# Patient Record
Sex: Female | Born: 1960 | Race: Black or African American | Hispanic: No | Smoking: Current every day smoker
Health system: Southern US, Community
[De-identification: ages and names within clinical notes are randomized; demographics above are authoritative.]

## PROBLEM LIST (undated history)

## (undated) DIAGNOSIS — E559 Vitamin D deficiency, unspecified: Secondary | ICD-10-CM

## (undated) DIAGNOSIS — K219 Gastro-esophageal reflux disease without esophagitis: Secondary | ICD-10-CM

## (undated) DIAGNOSIS — K5909 Other constipation: Secondary | ICD-10-CM

## (undated) DIAGNOSIS — Z800 Family history of malignant neoplasm of digestive organs: Secondary | ICD-10-CM

## (undated) DIAGNOSIS — K449 Diaphragmatic hernia without obstruction or gangrene: Secondary | ICD-10-CM

## (undated) DIAGNOSIS — K589 Irritable bowel syndrome without diarrhea: Secondary | ICD-10-CM

## (undated) DIAGNOSIS — R1311 Dysphagia, oral phase: Secondary | ICD-10-CM

## (undated) DIAGNOSIS — H269 Unspecified cataract: Secondary | ICD-10-CM

## (undated) HISTORY — PX: COLONOSCOPY: SHX174

## (undated) HISTORY — DX: Dysphagia, oral phase: R13.11

## (undated) HISTORY — DX: Other constipation: K59.09

## (undated) HISTORY — PX: RECTOVAGINAL FISTULA CLOSURE: SUR265

## (undated) HISTORY — PX: GANGLION CYST EXCISION: SHX1691

## (undated) HISTORY — DX: Gastro-esophageal reflux disease without esophagitis: K21.9

## (undated) HISTORY — DX: Unspecified cataract: H26.9

## (undated) HISTORY — DX: Diaphragmatic hernia without obstruction or gangrene: K44.9

## (undated) HISTORY — PX: UPPER GASTROINTESTINAL ENDOSCOPY: SHX188

## (undated) HISTORY — PX: EYE SURGERY: SHX253

## (undated) HISTORY — DX: Vitamin D deficiency, unspecified: E55.9

## (undated) HISTORY — PX: SHOULDER SURGERY: SHX246

## (undated) HISTORY — PX: ILEOSTOMY: SHX1783

## (undated) HISTORY — DX: Family history of malignant neoplasm of digestive organs: Z80.0

## (undated) HISTORY — PX: ESOPHAGEAL DILATION: SHX303

## (undated) HISTORY — PX: ILEOSTOMY CLOSURE: SHX1784

## (undated) HISTORY — PX: ABDOMINAL HYSTERECTOMY: SHX81

## (undated) HISTORY — DX: Irritable bowel syndrome, unspecified: K58.9

---

## 1999-10-02 ENCOUNTER — Other Ambulatory Visit: Admission: RE | Admit: 1999-10-02 | Discharge: 1999-10-02 | Payer: Self-pay | Admitting: *Deleted

## 2000-11-05 ENCOUNTER — Other Ambulatory Visit: Admission: RE | Admit: 2000-11-05 | Discharge: 2000-11-05 | Payer: Self-pay | Admitting: *Deleted

## 2000-12-18 ENCOUNTER — Other Ambulatory Visit: Admission: RE | Admit: 2000-12-18 | Discharge: 2000-12-18 | Payer: Self-pay | Admitting: Obstetrics and Gynecology

## 2000-12-18 ENCOUNTER — Encounter (INDEPENDENT_AMBULATORY_CARE_PROVIDER_SITE_OTHER): Payer: Self-pay

## 2000-12-24 ENCOUNTER — Inpatient Hospital Stay (HOSPITAL_COMMUNITY): Admission: RE | Admit: 2000-12-24 | Discharge: 2000-12-25 | Payer: Self-pay | Admitting: Obstetrics and Gynecology

## 2000-12-24 ENCOUNTER — Encounter (INDEPENDENT_AMBULATORY_CARE_PROVIDER_SITE_OTHER): Payer: Self-pay

## 2001-01-21 ENCOUNTER — Ambulatory Visit (HOSPITAL_COMMUNITY): Admission: RE | Admit: 2001-01-21 | Discharge: 2001-01-21 | Payer: Self-pay | Admitting: General Surgery

## 2001-01-21 ENCOUNTER — Encounter: Payer: Self-pay | Admitting: General Surgery

## 2002-04-15 ENCOUNTER — Encounter: Payer: Self-pay | Admitting: General Surgery

## 2002-04-15 ENCOUNTER — Encounter: Admission: RE | Admit: 2002-04-15 | Discharge: 2002-04-15 | Payer: Self-pay | Admitting: General Surgery

## 2002-12-29 ENCOUNTER — Other Ambulatory Visit: Admission: RE | Admit: 2002-12-29 | Discharge: 2002-12-29 | Payer: Self-pay | Admitting: Family Medicine

## 2008-08-25 DIAGNOSIS — K449 Diaphragmatic hernia without obstruction or gangrene: Secondary | ICD-10-CM | POA: Insufficient documentation

## 2008-08-25 DIAGNOSIS — F17200 Nicotine dependence, unspecified, uncomplicated: Secondary | ICD-10-CM | POA: Insufficient documentation

## 2008-08-25 DIAGNOSIS — F17210 Nicotine dependence, cigarettes, uncomplicated: Secondary | ICD-10-CM | POA: Insufficient documentation

## 2008-08-25 DIAGNOSIS — D259 Leiomyoma of uterus, unspecified: Secondary | ICD-10-CM | POA: Insufficient documentation

## 2008-08-25 DIAGNOSIS — K219 Gastro-esophageal reflux disease without esophagitis: Secondary | ICD-10-CM | POA: Insufficient documentation

## 2008-08-25 DIAGNOSIS — K589 Irritable bowel syndrome without diarrhea: Secondary | ICD-10-CM | POA: Insufficient documentation

## 2008-08-26 ENCOUNTER — Encounter: Payer: Self-pay | Admitting: Gastroenterology

## 2008-08-27 ENCOUNTER — Ambulatory Visit: Payer: Self-pay | Admitting: Gastroenterology

## 2008-08-27 ENCOUNTER — Telehealth: Payer: Self-pay | Admitting: Gastroenterology

## 2008-08-27 LAB — CONVERTED CEMR LAB
ALT: 12 units/L (ref 0–35)
AST: 18 units/L (ref 0–37)
Albumin: 3.6 g/dL (ref 3.5–5.2)
Alkaline Phosphatase: 65 units/L (ref 39–117)
BUN: 11 mg/dL (ref 6–23)
Basophils Absolute: 0.1 10*3/uL (ref 0.0–0.1)
Basophils Relative: 1.2 % (ref 0.0–3.0)
Bilirubin, Direct: 0.1 mg/dL (ref 0.0–0.3)
CO2: 29 meq/L (ref 19–32)
Calcium: 8.8 mg/dL (ref 8.4–10.5)
Chloride: 108 meq/L (ref 96–112)
Creatinine, Ser: 0.6 mg/dL (ref 0.4–1.2)
Eosinophils Absolute: 0.1 10*3/uL (ref 0.0–0.7)
Eosinophils Relative: 1.5 % (ref 0.0–5.0)
Ferritin: 46.1 ng/mL (ref 10.0–291.0)
Folate: 10.6 ng/mL
GFR calc Af Amer: 137 mL/min
GFR calc non Af Amer: 113 mL/min
Glucose, Bld: 92 mg/dL (ref 70–99)
HCT: 41.9 % (ref 36.0–46.0)
Hemoglobin: 14.7 g/dL (ref 12.0–15.0)
Iron: 99 ug/dL (ref 42–145)
Lymphocytes Relative: 43.9 % (ref 12.0–46.0)
MCHC: 35.1 g/dL (ref 30.0–36.0)
MCV: 91.6 fL (ref 78.0–100.0)
Monocytes Absolute: 0.4 10*3/uL (ref 0.1–1.0)
Monocytes Relative: 6.5 % (ref 3.0–12.0)
Neutro Abs: 2.8 10*3/uL (ref 1.4–7.7)
Neutrophils Relative %: 46.9 % (ref 43.0–77.0)
Platelets: 241 10*3/uL (ref 150–400)
Potassium: 4.5 meq/L (ref 3.5–5.1)
RBC: 4.57 M/uL (ref 3.87–5.11)
RDW: 13.2 % (ref 11.5–14.6)
Saturation Ratios: 41.3 % (ref 20.0–50.0)
Sodium: 140 meq/L (ref 135–145)
TSH: 0.46 microintl units/mL (ref 0.35–5.50)
Total Bilirubin: 0.6 mg/dL (ref 0.3–1.2)
Total Protein: 7.1 g/dL (ref 6.0–8.3)
Transferrin: 171.1 mg/dL — ABNORMAL LOW (ref >=212.0)
Vitamin B-12: 367 pg/mL (ref 211–911)
WBC: 6.1 10*3/uL (ref 4.5–10.5)

## 2008-08-31 ENCOUNTER — Telehealth: Payer: Self-pay | Admitting: Gastroenterology

## 2008-09-06 ENCOUNTER — Ambulatory Visit: Payer: Self-pay | Admitting: Gastroenterology

## 2008-09-06 ENCOUNTER — Encounter: Payer: Self-pay | Admitting: Gastroenterology

## 2008-09-08 ENCOUNTER — Encounter: Payer: Self-pay | Admitting: Gastroenterology

## 2009-03-25 ENCOUNTER — Ambulatory Visit: Payer: Self-pay | Admitting: Sports Medicine

## 2009-03-25 ENCOUNTER — Encounter (INDEPENDENT_AMBULATORY_CARE_PROVIDER_SITE_OTHER): Payer: Self-pay | Admitting: *Deleted

## 2009-03-25 DIAGNOSIS — L84 Corns and callosities: Secondary | ICD-10-CM | POA: Insufficient documentation

## 2009-03-25 DIAGNOSIS — M79609 Pain in unspecified limb: Secondary | ICD-10-CM | POA: Insufficient documentation

## 2009-04-05 ENCOUNTER — Ambulatory Visit: Payer: Self-pay | Admitting: Sports Medicine

## 2009-04-05 ENCOUNTER — Encounter (INDEPENDENT_AMBULATORY_CARE_PROVIDER_SITE_OTHER): Payer: Self-pay | Admitting: *Deleted

## 2011-03-09 NOTE — Op Note (Signed)
[facility]  Patient:    Gina [name], Gina [name]           MRN: [id number] Proc. Date: 12/24/00 Adm. Date:  [id number] Disc. 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  OPERATIVE PROCEDURE:  Transvaginal hysterectomy.  SURGEON:  Laqueta Linden, M.D.  ASSISTANT:  Cheryle Horsfall, M.D.  ESTIMATED BLOOD LOSS:  100 cc.  ANESTHESIA:  General endotracheal.  URINE OUTPUT:  50 cc catheterized at conclusion of procedure.  COUNTS:  Correct x 2.  COMPLICATIONS:  None.  INDICATIONS:  The patient is a 50 year old gravida 4, para 3 female who has had a several year history of worsening menorrhagia, dysmenorrhea, pelvic pain and dyspareunia. She has attempted medical management of her menorrhagia with multiple agents including Vioxx, Magsal, Ponstel and progesterone pills without any improvement in her bleeding pattern. Endometrial biopsy was benign. Pelvic ultrasound revealed multiple intramural and serosal fibroids with sonohysterogram revealing no endometrial defects amenable to conservative management. The patient was offered alternatives of continued attempts at medical management versus definitive surgery and chose the latter. She has been extensively counseled as to the risks, benefits and alternatives and complications and agrees to proceed. She understands that this will render permanently and irreversibly sterilized. She has received Ancef 1 g IV antibiotic prophylaxis preoperatively.  DESCRIPTION OF PROCEDURE:  The patient was taken to the operating room and after proper identification and consents were ascertained, she was placed on the operating table in the supine position. After the induction of general endotracheal anesthesia, she was placed in  the candy cane stirrups and the perineum and vagina were prepped and draped in a routine sterile fashion. The bladder was emptied with a red rubber catheter. A weighted speculum was placed in the posterior vagina. The cervix was grasped with a single tooth tenaculum. A minimal amount of descent was noted. The cervix was injected circumferentially with a 1:100,000 solution of epinephrine. The posterior vaginal mucosa was then incised with a scalpel and the cul-de-sac sharply entered. Very tight high uterosacral ligaments were noted. The remainder of the cervix was then circumscribed with a scalpel and the mucosa advanced off of the cervix using a finger and a Ray-Tec sponge. The uterosacral ligaments were clamped, cut and Heaney ligated with #0 Vicryl suture and tagged. The cardinal ligaments were similarly clamped, cut and ligated. The uterus continued to have rather poor descent felt to be related to the globular size and the location of the multiple fibroids. Several additional pedicles were taken after identification of the anterior peritoneal reflection which was entered sharply without obvious injury or entry into the bladder. The uterine vessels were clamped incorporating the anterior and posterior peritoneum and the pedicles suture ligated. At this point the uterus was flipped posteriorly. Several large serosal fibroids measuring up to 3-4 cm were noted. Several fibroids were enucleated to improve visualization. Curved Heaney clamps were placed across the proximal adnexal pedicles bilaterally, the pedicles cut and the specimen removed. Both tubes and ovaries appeared completely normal. The proximal round ligament, fallopian tube and utero-ovarian ligament were incorporated into a single pedicles; these pedicles were triply ligated with two free ties and a stitch of #0 Vicryl; hemostasis was noted to be excellent.  At this point the posterior vagina was reefed to the posterior  peritoneum with a marked improvement in the slight amount of oozing that was noted. The parietoperitoneum was then closed in a pursestring suture fashion using 2-0 Vicryl suture with exteriorization of all pedicles. The adnexal pedicles were noted to be hemostatic and were released into the peritoneal cavity. Counts were correct prior to closure of the peritoneum. At this point the vaginal cuff was closed from side-to-side with interrupted figure-of-eight stitches of #0 Vicryl. The uterosacral tags were tied in the midline. Hemostasis was noted to be excellent with no active bleeding from the cuff noted at the conclusion of the procedure. No packing was left in place. The bladder was emptied with an in and out catheter of 55 cc of clear urine that had accumulated during the 1 hour procedure. The patient was stable and extubated upon transfer to the recovery room.  Estimated blood loss was 100 cc. Urine output was 55 cc. Counts were correct x 2 and there were no complications. DD:  12/24/00 TD:  12/25/00 Job: [id number]

## 2011-03-09 NOTE — H&P (Signed)
[facility]  Patient:    Gina [name], Gina [name]                   MRN: [id number] Adm. Date:  12/24/00 Attending:  Laqueta Linden, M.D.                         History and Physical  IDENTIFICATION:  Gina [name] is a 50 year old female with fibroids, pelvic pain, and dyspareunia unresponsive to medical management admitted for vaginal hysterectomy.  HISTORY OF PRESENT ILLNESS:  Gina [name] is a 50 year old gravida 3, para 3 married black female who has been followed for approximately four years with complaints of worsening dysmenorrhea, menorrhagia, pelvic pain, and dyspareunia.  She has tried multiple managements including Magsal, ______, Vioxx, and most recently Micronor with ongoing erratic bleeding and continued pelvic pain.  She underwent endometrial sampling which revealed benign secretory endometrium with a small endometrial polyp.  She underwent pelvic ultrasound, which revealed multiple serosal and intramural fibroids with a sonohysterogram revealing no intrauterine lesion amenable to conservative surgical resection.  She was counseled as to risks, benefits, and alternatives including embolization versus ablation versus continued medical management versus a hysterectomy and elected to proceed to the latter.  She is now admitted for same-day surgery for a vaginal hysterectomy.  She understands that this will render her permanently and irreversibly sterilized as well. The patient is admitted now for same-day surgery.  PAST MEDICAL HISTORY:  Negative.  PAST SURGICAL HISTORY:  Cyst excised from right hand August 1997.  D&C x 1.  PAST OBSTETRICAL HISTORY:  Term vaginal delivery x 3 without complications; largest infant 8 pounds 6 ounces.  One termination via D&C.  PAST GYNECOLOGICAL HISTORY:  History of gonorrhea in the distant past, treated.  Last Pap smear in January 2002 within normal limits.  Mammogram September 2001 within normal  limits.  ALLERGIES:  No known drug allergies or latex sensitivities.  CURRENT MEDICATIONS: 1. Micronor 1 p.o. q.d. 2. Nonsteroidal medications which have been discontinued two weeks prior to    surgery.  TRANSFUSION HISTORY:  Negative.  FAMILY HISTORY:  Positive for diabetes in her mother and sister.  New diagnosis of colon cancer in her mother.  Otherwise, noncontributory.  SOCIAL HISTORY:  The patient is married with three children, the youngest of whom is 10.  She smokes one pack per day.  She works at [employer]. She denies any alcohol abuse or illicit drug use.  REVIEW OF SYSTEMS:  Notable for the history of present illness.  She denies any cardiovascular, respiratory, neurologic, or GI complaints.  PHYSICAL EXAMINATION:  GENERAL:  A healthy black female in no apparent distress.  VITAL SIGNS:  Height 5 feet 9 inches, weight 208 pounds, blood pressure 110/72, respirations 18, pulse 80 and regular, temperature 99.2.  HEENT:  Grossly negative.  Oropharynx clear.  NECK:  Supple without thyromegaly or lymphadenopathy.  CHEST:  Without spine or CVA tenderness.  LUNGS:  Clear to auscultation bilaterally.  BREASTS:  Without masses.  HEART:  Regular rate and rhythm without murmurs, gallops, or rubs.  Carotids +2 and equal without bruits.  Distal pulses full.  ABDOMEN:  Soft, nontender without hepatosplenomegaly.  PELVIC:  Normal female external genitalia.  Clean vagina and cervix with marginal uterine descent noted.  Uterus is 6-8 week size, irregular, with no obvious adnexal masses.  Rectovaginal examination confirmatory.  EXTREMITIES:  Without edema.  NEUROLOGIC:  Grossly nonfocal.  ADMISSION LABORATORY DATA:  Hemoglobin  14.0, hematocrit 40.9, white count 6.1. Normal platelets.  Normal metabolic profile.  Normal coagulation studies. Negative urinalysis.  Chest x-ray and EKG deferred per anesthesia.  IMPRESSION ON ADMISSION: 1. Leiomyoma uteri, serosal  and intramural with poor response to multiple    medical management attempts. 2. Smoker, one pack per day.  PLAN:  The patient is admitted for same-day surgery.  She will undergo a vaginal hysterectomy with abdominal approach if necessary.  Ovaries will be conserved due to her young age, unless necessary to be removed.  Full consent has been given including permanent and irreversible sterilization. DD:  12/24/00 TD:  12/24/00 Job: [id number]

## 2011-11-14 ENCOUNTER — Other Ambulatory Visit: Payer: Self-pay | Admitting: Internal Medicine

## 2011-11-22 ENCOUNTER — Other Ambulatory Visit: Payer: Self-pay

## 2011-11-28 ENCOUNTER — Ambulatory Visit
Admission: RE | Admit: 2011-11-28 | Discharge: 2011-11-28 | Disposition: A | Discharge status: Home / Self Care | Payer: 59 | Source: Ambulatory Visit | Attending: Internal Medicine | Admitting: Internal Medicine

## 2011-11-29 ENCOUNTER — Encounter: Payer: Self-pay | Admitting: Gastroenterology

## 2011-12-10 ENCOUNTER — Encounter: Payer: Self-pay | Admitting: *Deleted

## 2011-12-13 ENCOUNTER — Encounter: Payer: Self-pay | Admitting: Gastroenterology

## 2011-12-13 ENCOUNTER — Ambulatory Visit (INDEPENDENT_AMBULATORY_CARE_PROVIDER_SITE_OTHER): Payer: 59 | Admitting: Gastroenterology

## 2011-12-13 ENCOUNTER — Encounter: Payer: Self-pay | Admitting: *Deleted

## 2011-12-13 DIAGNOSIS — K5909 Other constipation: Secondary | ICD-10-CM | POA: Insufficient documentation

## 2011-12-13 DIAGNOSIS — Z9889 Other specified postprocedural states: Secondary | ICD-10-CM

## 2011-12-13 DIAGNOSIS — Z800 Family history of malignant neoplasm of digestive organs: Secondary | ICD-10-CM

## 2011-12-13 DIAGNOSIS — M47812 Spondylosis without myelopathy or radiculopathy, cervical region: Secondary | ICD-10-CM

## 2011-12-13 DIAGNOSIS — R1031 Right lower quadrant pain: Secondary | ICD-10-CM | POA: Insufficient documentation

## 2011-12-13 DIAGNOSIS — Z9049 Acquired absence of other specified parts of digestive tract: Secondary | ICD-10-CM

## 2011-12-13 DIAGNOSIS — R1310 Dysphagia, unspecified: Secondary | ICD-10-CM | POA: Insufficient documentation

## 2011-12-13 DIAGNOSIS — K5900 Constipation, unspecified: Secondary | ICD-10-CM

## 2011-12-13 HISTORY — DX: Other constipation: K59.09

## 2011-12-13 MED ORDER — LINACLOTIDE 145 MCG PO CAPS: 1.0000 | ORAL_CAPSULE | Freq: Every day | ORAL | Status: DC

## 2011-12-13 MED ORDER — PEG-KCL-NACL-NASULF-NA ASC-C 100 G PO SOLR: 1.0000 | Freq: Once | ORAL | Status: DC

## 2011-12-13 NOTE — Patient Instructions (Addendum)
You have been given a separate informational sheet regarding your tobacco use, the importance of quitting and local resources to help you quit. Your procedure has been scheduled for 12/17/2011, please follow the seperate instructions.  Your prescription(s) have been sent to you pharmacy.  Take Linzess one tablet by mouth on a empty stomach, once a day.

## 2011-12-13 NOTE — Progress Notes (Addendum)
History of Present Illness:  This is a very pleasant 51 year old African American female with chronic constipation and recurrent right lower quadrant pain. She had colonoscopy and endoscopy 4 years ago. The patient has had a previous sigmoid resection because of endometriosis, and had a temporary colostomy. She continues with vague acid reflux symptoms, but her main complaint today is pill dysphagia. [facility] showed some nonspecific tertiary contractions with occasional impaction of a pill in her recurrent pharyngeal area. There also were degenerative changes in the cervical spine noted. 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 Thurston Hole in orthopedics.. The patient denies true reflux symptoms and is not on PPI therapy. She tends to be constipated but denies melena or hematochezia. Her mother had colon cancer at age 62.    I have reviewed this patient's present history, medical and surgical past history, allergies and medications   .     ROS: The remainder of the 10 point ROS is negative.. no specific food intolerances, anorexia or weight loss. Denies abuse of alcohol, cigarettes, or NSAIDs.     Physical Exam: General well developed well nourished patient in no acute distress, appearing her stated age Eyes PERRLA, no icterus, fundoscopic exam per opthamologist Skin no lesions noted Neck supple, no adenopathy, no thyroid enlargement, no tenderness Chest clear to percussion and auscultation Heart no significant murmurs, gallops or rubs noted Abdomen no hepatosplenomegaly masses or tenderness, BS normal.  Rectal inspection normal no fissures, or fistulae noted.  No masses or tenderness on digital exam. Stool guaiac negative. Extremities no acute joint lesions, edema, phlebitis or evidence of cellulitis. Neurologic patient oriented x 3, cranial nerves intact, no focal neurologic deficits noted. Psychological mental status normal and  normal affect.  Assessment and plan: Unusual dysphagia only for pills was consistent with an element of neurogenic dysphagia. Patient has no other neurological or neuromuscular symptoms except for a problem with cervical spondylosis, she may have some esophageal external compression. We will perform endoscopy with propofol sedation and perform appropriate dilation if indicated. I also will repeat her colonoscopy because of her family history and her worsening constipation. I placed her on Linzess  145 mg a day for constipation, high fiber diet liberal by mouth fluids. Patient relates her recent labs by primary care were all normal. This patient has had previous sigmoid resection.    Encounter Diagnoses  Name Primary?  Marland Kitchen Dysphagia   . Family history of malignant neoplasm of gastrointestinal tract   . RLQ abdominal pain

## 2011-12-14 ENCOUNTER — Encounter: Payer: Self-pay | Admitting: Gastroenterology

## 2011-12-17 ENCOUNTER — Ambulatory Visit (AMBULATORY_SURGERY_CENTER): Payer: 59 | Admitting: Gastroenterology

## 2011-12-17 ENCOUNTER — Encounter: Payer: Self-pay | Admitting: Gastroenterology

## 2011-12-17 DIAGNOSIS — Z860100 Personal history of colon polyps, unspecified: Secondary | ICD-10-CM | POA: Insufficient documentation

## 2011-12-17 DIAGNOSIS — Z800 Family history of malignant neoplasm of digestive organs: Secondary | ICD-10-CM

## 2011-12-17 DIAGNOSIS — K219 Gastro-esophageal reflux disease without esophagitis: Secondary | ICD-10-CM

## 2011-12-17 DIAGNOSIS — K5900 Constipation, unspecified: Secondary | ICD-10-CM

## 2011-12-17 DIAGNOSIS — R1031 Right lower quadrant pain: Secondary | ICD-10-CM | POA: Insufficient documentation

## 2011-12-17 DIAGNOSIS — R1310 Dysphagia, unspecified: Secondary | ICD-10-CM | POA: Insufficient documentation

## 2011-12-17 MED ORDER — SODIUM CHLORIDE 0.9 % IV SOLN: 500.0000 mL | INTRAVENOUS | Status: DC

## 2011-12-17 NOTE — Progress Notes (Signed)
Patient did not experience any of the following events: a burn prior to discharge; a fall within the facility; wrong site/side/patient/procedure/implant event; or a hospital transfer or hospital admission upon discharge from the facility. (G8907) Patient did not have preoperative order for IV antibiotic SSI prophylaxis. (G8918)  

## 2011-12-17 NOTE — Op Note (Signed)
[facility] [address] [place], [place]  [address]  ENDOSCOPY PROCEDURE REPORT  PATIENT:  Gina [name], Gina [name]  MR#:  [id number] BIRTHDATE:  10-07-1961, 50 yrs. old  GENDER:  female  ENDOSCOPIST:  Vania Rea. Jarold Motto, MD, [facility] Referred by:  PROCEDURE DATE:  12/17/2011 PROCEDURE:  EGD, diagnostic 43235, Maloney Dilation of Esophagus ASA CLASS:  Class II INDICATIONS:  dysphagia  MEDICATIONS:   There was residual sedation effect present from prior procedure., propofol (Diprivan) 80 mg IV TOPICAL ANESTHETIC:  DESCRIPTION OF PROCEDURE:   After the risks and benefits of the procedure were explained, informed consent was obtained.  The LB GIF-H180 [id number] endoscope was introduced through the mouth and advanced to the second portion of the duodenum.  The instrument was slowly withdrawn as the mucosa was fully examined. <<PROCEDUREIMAGES>>  The upper, middle, and distal third of the esophagus were carefully inspected and no abnormalities were noted. The z-line was well seen at the GEJ. The endoscope was pushed into the fundus which was normal including a retroflexed view. The antrum,gastric body, first and second part of the duodenum were unremarkable. DILATED #18F MALONEY DILATOR.SLIGHT HEME NOTED,NO PAIN. Retroflexed views revealed a hiatal hernia.  SMALL 2 CM, HH NOTED. The scope was then withdrawn from the patient and the procedure completed.  COMPLICATIONS:  None  ENDOSCOPIC IMPRESSION: 1) Normal EGD 2) A hiatal hernia PROBABLE OCCULT STRICTURE DILATED FROM GERD VS.NEUROGENIC PILL DYSPHAGIA. RECOMMENDATIONS: 1) Clear liquids until, then soft foods rest iof day. Resume prior diet tomorrow. 2) continue current medications  ______________________________ Vania Rea. Jarold Motto, MD, [facility]  CC:  Juline Patch, MD  n. Rosalie Doctor:   Vania Rea. Jadene Stemmer at 12/17/2011 02:30 PM  Tinnie Gens, [phone]

## 2011-12-17 NOTE — Patient Instructions (Addendum)
FOLLOW THE DILATATION DIET TODAY.     NOTHING TO EAT OR DRINK UNTIL 3:30 PM     FROM 3:30 PM UNTIL 4:30 PM ONLY CLEAR LIQUIDS.    AFTER 4:30 SOFT FOODS UNTIL MORNING, THEN RESUME YOUR REGULAR DIET.    YOU HAD AN ENDOSCOPIC PROCEDURE TODAY AT THE [facility]: Refer to the procedure report that was given to you for any specific questions about what was found during the examination.  If the procedure report does not answer your questions, please call your gastroenterologist to clarify.  If you requested that your care partner not be given the details of your procedure findings, then the procedure report has been included in a sealed envelope for you to review at your convenience later.  YOU SHOULD EXPECT: Some feelings of bloating in the abdomen. Passage of more gas than usual.  Walking can help get rid of the air that was put into your GI tract during the procedure and reduce the bloating. If you had a lower endoscopy (such as a colonoscopy or flexible sigmoidoscopy) you may notice spotting of blood in your stool or on the toilet paper. If you underwent a bowel prep for your procedure, then you may not have a normal bowel movement for a few days.  DIET: Your first meal following the procedure should be a light meal and then it is ok to progress to your normal diet.  A half-sandwich or bowl of soup is an example of a good first meal.  Heavy or fried foods are harder to digest and may make you feel nauseous or bloated.  Likewise meals heavy in dairy and vegetables can cause extra gas to form and this can also increase the bloating.  Drink plenty of fluids but you should avoid alcoholic beverages for 24 hours.  ACTIVITY: Your care partner should take you home directly after the procedure.  You should plan to take it easy, moving slowly for the rest of the day.  You can resume normal activity the day after the procedure however you should NOT DRIVE or use heavy machinery for 24 hours (because of the  sedation medicines used during the test).    SYMPTOMS TO REPORT IMMEDIATELY: A gastroenterologist can be reached at any hour.  During normal business hours, 8:30 AM to 5:00 PM Monday through Friday, call [phone].  After hours and on weekends, please call the GI answering service at [phone] who will take a message and have the physician on call contact you.   Following lower endoscopy (colonoscopy or flexible sigmoidoscopy):  Excessive amounts of blood in the stool  Significant tenderness or worsening of abdominal pains  Swelling of the abdomen that is new, acute  Fever of 100F or higher  Following upper endoscopy (EGD)  Vomiting of blood or coffee ground material  New chest pain or pain under the shoulder blades  Painful or persistently difficult swallowing  New shortness of breath  Fever of 100F or higher  Black, tarry-looking stools  FOLLOW UP: If any biopsies were taken you will be contacted by phone or by letter within the next 1-3 weeks.  Call your gastroenterologist if you have not heard about the biopsies in 3 weeks.  Our staff will call the home number listed on your records the next business day following your procedure to check on you and address any questions or concerns that you may have at that time regarding the information given to you following your procedure. This is  a courtesy call and so if there is no answer at the home number and we have not heard from you through the emergency physician on call, we will assume that you have returned to your regular daily activities without incident.  SIGNATURES/CONFIDENTIALITY: You and/or your care partner have signed paperwork which will be entered into your electronic medical record.  These signatures attest to the fact that that the information above on your After Visit Summary has been reviewed and is understood.  Full responsibility of the confidentiality of this discharge information lies with you and/or your  care-partner.

## 2011-12-17 NOTE — Op Note (Signed)
[facility] [address] [place], [place]  [address]  COLONOSCOPY PROCEDURE REPORT  PATIENT:  Gina [name], Gina [name]  MR#:  [id number] BIRTHDATE:  04-06-1961, 50 yrs. old  GENDER:  female ENDOSCOPIST:  Vania Rea. Jarold Motto, MD, [facility] REF. BY: PROCEDURE DATE:  12/17/2011 PROCEDURE:  Average-risk screening colonoscopy G0121 ASA CLASS:  Class II INDICATIONS:  history of polyps MEDICATIONS:   propofol (Diprivan) 120 mg IV  DESCRIPTION OF PROCEDURE:   After the risks and benefits and of the procedure were explained, informed consent was obtained. Digital rectal exam was performed and revealed no abnormalities. The LB 180AL [id number] endoscope was introduced through the anus and advanced to the cecum, which was identified by both the appendix and ileocecal valve.  The quality of the prep was excellent, using MoviPrep.  The instrument was then slowly withdrawn as the colon was fully examined. <<PROCEDUREIMAGES>>  FINDINGS:  No polyps or cancers were seen.  This was otherwise a normal examination of the colon.   Retroflexed views in the rectum revealed no abnormalities.    The scope was then withdrawn from the patient and the procedure completed.  COMPLICATIONS:  None ENDOSCOPIC IMPRESSION: 1) No polyps or cancers 2) Otherwise normal examination RECOMMENDATIONS: 1) Repeat Colonscopy in 10 years.  REPEAT EXAM:  No  ______________________________ Vania Rea. Jarold Motto, MD, [facility]  CC:  Juline Patch, MD  n. Rosalie Doctor:   Vania Rea. Kelin Borum at 12/17/2011 02:18 PM  Tinnie Gens, [phone]

## 2011-12-18 ENCOUNTER — Telehealth: Payer: Self-pay

## 2011-12-18 NOTE — Telephone Encounter (Signed)
  Follow up Call-  Call back number 12/17/2011  Post procedure Call Back phone  # [phone]  Permission to leave phone message Yes     Patient questions:  Do you have a fever, pain , or abdominal swelling? no Pain Score  2 *  Have you tolerated food without any problems? yes  Have you been able to return to your normal activities? yes  Do you have any questions about your discharge instructions: Diet   no Medications  no Follow up visit  no  Do you have questions or concerns about your Care? no  Actions: * If pain score is 4 or above: No action needed, pain <4. Per the pt she has a scratchy throat.  Rating it 2/10.  She can swallow but still scratchy this am.  Per the pt she is going to stay on the soft diet today.  I advised her this was a good idea and to please call us if her sx are not improving.  The pt states she will call if needed.  No heme noted or any other sx.  maw

## 2011-12-20 ENCOUNTER — Telehealth: Payer: Self-pay | Admitting: Gastroenterology

## 2011-12-21 ENCOUNTER — Telehealth: Payer: Self-pay | Admitting: Gastroenterology

## 2011-12-21 MED ORDER — CILIDINIUM-CHLORDIAZEPOXIDE 2.5-5 MG PO CAPS: 1.0000 | ORAL_CAPSULE | Freq: Three times a day (TID) | ORAL | Status: DC

## 2011-12-21 NOTE — Telephone Encounter (Signed)
Informed pt of Dr Norval Gable findings and that he will order Librax for her spasms; pt stated understanding.

## 2011-12-21 NOTE — Telephone Encounter (Signed)
See previous note

## 2011-12-21 NOTE — Telephone Encounter (Signed)
These are consistent with her multiple functional somatic complaints. I would give her some Librax to use when necessary, she does not need further testing.

## 2011-12-21 NOTE — Telephone Encounter (Signed)
Pt had ECL on 12/17/11, Monday. Last OV 12/13/11 for constipation and dysphagia when swallowing pills. She was give Linzess for the constipation and the COLON was normal. 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 She was to eat soft foods that day, then advance. Pt called reporting the soreness in her throat has eased, but when she swallows she feels as though there's a knot in her throat; she remains on soft food and liquids. She also reports SOB. She states she can be talking and gets out of breath and has to stop talking until she gets her breath back. Also reports she hasn't had a BM since her prep. She has Linzess ordered, but has been afraid to try it. Please advise on the knot and SOB. Thanks.

## 2012-05-02 ENCOUNTER — Other Ambulatory Visit: Payer: Self-pay | Admitting: Internal Medicine

## 2012-05-02 DIAGNOSIS — M7989 Other specified soft tissue disorders: Secondary | ICD-10-CM

## 2012-05-09 ENCOUNTER — Ambulatory Visit
Admission: RE | Admit: 2012-05-09 | Discharge: 2012-05-09 | Disposition: A | Discharge status: Home / Self Care | Payer: 59 | Source: Ambulatory Visit | Attending: Internal Medicine | Admitting: Internal Medicine

## 2012-05-09 DIAGNOSIS — M7989 Other specified soft tissue disorders: Secondary | ICD-10-CM

## 2012-10-05 ENCOUNTER — Emergency Department (HOSPITAL_BASED_OUTPATIENT_CLINIC_OR_DEPARTMENT_OTHER)
Admission: EM | Admit: 2012-10-05 | Discharge: 2012-10-05 | Disposition: A | Discharge status: Home / Self Care | Payer: 59 | Attending: Emergency Medicine | Admitting: Emergency Medicine

## 2012-10-05 ENCOUNTER — Emergency Department (HOSPITAL_BASED_OUTPATIENT_CLINIC_OR_DEPARTMENT_OTHER): Payer: 59

## 2012-10-05 ENCOUNTER — Encounter (HOSPITAL_BASED_OUTPATIENT_CLINIC_OR_DEPARTMENT_OTHER): Payer: Self-pay | Admitting: Emergency Medicine

## 2012-10-05 DIAGNOSIS — Z8719 Personal history of other diseases of the digestive system: Secondary | ICD-10-CM | POA: Insufficient documentation

## 2012-10-05 DIAGNOSIS — F17200 Nicotine dependence, unspecified, uncomplicated: Secondary | ICD-10-CM | POA: Insufficient documentation

## 2012-10-05 DIAGNOSIS — K589 Irritable bowel syndrome without diarrhea: Secondary | ICD-10-CM | POA: Insufficient documentation

## 2012-10-05 DIAGNOSIS — Z862 Personal history of diseases of the blood and blood-forming organs and certain disorders involving the immune mechanism: Secondary | ICD-10-CM | POA: Insufficient documentation

## 2012-10-05 DIAGNOSIS — Z8639 Personal history of other endocrine, nutritional and metabolic disease: Secondary | ICD-10-CM | POA: Insufficient documentation

## 2012-10-05 DIAGNOSIS — Z79899 Other long term (current) drug therapy: Secondary | ICD-10-CM | POA: Insufficient documentation

## 2012-10-05 DIAGNOSIS — K219 Gastro-esophageal reflux disease without esophagitis: Secondary | ICD-10-CM | POA: Insufficient documentation

## 2012-10-05 DIAGNOSIS — M549 Dorsalgia, unspecified: Secondary | ICD-10-CM | POA: Insufficient documentation

## 2012-10-05 DIAGNOSIS — R0602 Shortness of breath: Secondary | ICD-10-CM | POA: Insufficient documentation

## 2012-10-05 DIAGNOSIS — R42 Dizziness and giddiness: Secondary | ICD-10-CM | POA: Insufficient documentation

## 2012-10-05 DIAGNOSIS — R079 Chest pain, unspecified: Secondary | ICD-10-CM

## 2012-10-05 DIAGNOSIS — Z8379 Family history of other diseases of the digestive system: Secondary | ICD-10-CM | POA: Insufficient documentation

## 2012-10-05 DIAGNOSIS — R110 Nausea: Secondary | ICD-10-CM | POA: Insufficient documentation

## 2012-10-05 LAB — BASIC METABOLIC PANEL
BUN: 15 mg/dL (ref 6–23)
Calcium: 9.3 mg/dL (ref 8.4–10.5)
Creatinine, Ser: 1 mg/dL (ref 0.50–1.10)
GFR calc Af Amer: 74 mL/min — ABNORMAL LOW (ref >90)
GFR calc non Af Amer: 64 mL/min — ABNORMAL LOW (ref >90)
Glucose, Bld: 98 mg/dL (ref 70–99)

## 2012-10-05 LAB — CBC WITH DIFFERENTIAL/PLATELET
Basophils Relative: 0 % (ref 0–1)
Eosinophils Absolute: 0.1 10*3/uL (ref 0.0–0.7)
Eosinophils Relative: 1 % (ref 0–5)
HCT: 42.7 % (ref 36.0–46.0)
Hemoglobin: 14.9 g/dL (ref 12.0–15.0)
Lymphs Abs: 3.7 10*3/uL (ref 0.7–4.0)
MCH: 30.7 pg (ref 26.0–34.0)
MCHC: 34.9 g/dL (ref 30.0–36.0)
MCV: 88 fL (ref 78.0–100.0)
Monocytes Absolute: 0.5 10*3/uL (ref 0.1–1.0)
Monocytes Relative: 7 % (ref 3–12)
Neutrophils Relative %: 39 % — ABNORMAL LOW (ref 43–77)
RBC: 4.85 MIL/uL (ref 3.87–5.11)

## 2012-10-05 LAB — D-DIMER, QUANTITATIVE: D-Dimer, Quant: 0.34 ug/mL-FEU (ref 0.00–0.48)

## 2012-10-05 MED ORDER — HYDROCODONE-ACETAMINOPHEN 5-325 MG PO TABS: 1.0000 | ORAL_TABLET | Freq: Four times a day (QID) | ORAL | Status: DC | PRN

## 2012-10-05 MED ORDER — SODIUM CHLORIDE 0.9 % IV BOLUS (SEPSIS)
250.0000 mL | Freq: Once | INTRAVENOUS | Status: AC
  Administered 2012-10-05: 1000 mL via INTRAVENOUS

## 2012-10-05 MED ORDER — ONDANSETRON HCL 4 MG/2ML IJ SOLN
4.0000 mg | Freq: Once | INTRAMUSCULAR | Status: AC
  Administered 2012-10-05: 4 mg via INTRAVENOUS
  Filled 2012-10-05: qty 2

## 2012-10-05 MED ORDER — CYCLOBENZAPRINE HCL 10 MG PO TABS: 10.0000 mg | ORAL_TABLET | Freq: Two times a day (BID) | ORAL | Status: DC | PRN

## 2012-10-05 MED ORDER — HYDROMORPHONE HCL PF 1 MG/ML IJ SOLN
1.0000 mg | Freq: Once | INTRAMUSCULAR | Status: AC
  Administered 2012-10-05: 1 mg via INTRAVENOUS
  Filled 2012-10-05: qty 1

## 2012-10-05 MED ORDER — SODIUM CHLORIDE 0.9 % IV SOLN: INTRAVENOUS | Status: DC

## 2012-10-05 NOTE — ED Notes (Signed)
Patient transported to X-ray 

## 2012-10-05 NOTE — ED Provider Notes (Signed)
History  This chart was scribed for Shelda Jakes, MD by Shari Heritage, ED Scribe. The patient was seen in room MH10/MH10. Patient's care was started at 1816.  CSN: [id number]  Arrival date & time 10/05/12  1737   First MD Initiated Contact with Patient 10/05/12 1816      Chief Complaint  Patient presents with  . Chest Pain  . Back Pain    Patient is a 51 y.o. female presenting with back pain. The history is provided by the patient. No language interpreter was used.  Back Pain  This is a new problem. The current episode started 6 to 12 hours ago. The problem occurs constantly. The problem has not changed since onset.The pain is associated with no known injury. The pain is present in the thoracic spine. Radiates to: around to sternum of chest. The pain is moderate. The pain is the same all the time. Associated symptoms include chest pain. Pertinent negatives include no fever, no headaches and no dysuria. Treatments tried: aspirin.    HPI Comments: Gina [name] is a 51 y.o. female who presents to the Emergency Department complaining of sudden onset, constant, dull, left-sided thoracic back pain radiating around to the sternum of the chest onset 10 hours ago. Patient says that she wasn't doing anything unusual at the time and that she felt okay yesterday. Certain movements, breathing and palpation to the back make the pain worse. Patient says that she took Aspirin at the onset of pain. There was associated nausea, shortness of breath and dizziness when pain first began. Patient denies headaches, vomiting, diarrhea, lower back pain, fever, congestion, sore throat, cough, rhinorrhea, headache, neck pain, extremity pain, rash, or dysuria. Patient does not have a history of bleeding easily. Patient's medical history includes hiatal hernia, esophageal reflux and IBS.   Past Medical History  Diagnosis Date  . Dysphagia, oral phase   . Family history of malignant neoplasm of  gastrointestinal tract   . Unspecified vitamin D deficiency   . Hiatal hernia   . Esophageal reflux   . Irritable bowel syndrome   . Constipation     Past Surgical History  Procedure Date  . Abdominal hysterectomy   . Rectovaginal fistula closure   . Ileostomy   . Shoulder surgery   . Colonoscopy   . Esophageal dilation     Family History  Problem Relation Age of Onset  . Colon cancer Mother   . Hypertension Mother   . Diabetes Mother   . Coronary artery disease Mother     History  Substance Use Topics  . Smoking status: Current Every Day Smoker -- 0.5 packs/day    Types: Cigarettes  . Smokeless tobacco: Never Used  . Alcohol Use: Yes     Comment: occ.    OB History    Grav Para Term Preterm Abortions TAB SAB Ect Mult Living                  Review of Systems  Constitutional: Negative for fever.  HENT: Negative for congestion, sore throat, rhinorrhea and neck pain.   Eyes: Negative.   Respiratory: Negative for cough and shortness of breath.   Cardiovascular: Positive for chest pain.  Gastrointestinal: Positive for nausea. Negative for vomiting and diarrhea.  Genitourinary: Negative for dysuria.  Musculoskeletal: Positive for back pain.  Skin: Negative for rash.  Neurological: Negative for headaches.    Allergies  Review of patient's allergies indicates no known allergies.  Home Medications  Current Outpatient Rx  Name  Route  Sig  Dispense  Refill  . CLINDINIUM-CHLORDIAZEPOXIDE 2.5-5 MG PO CAPS   Oral   Take 1 capsule by mouth 4 (four) times daily -  before meals and at bedtime.   120 capsule   0   . CYCLOBENZAPRINE HCL 10 MG PO TABS   Oral   Take 1 tablet (10 mg total) by mouth 2 (two) times daily as needed for muscle spasms.   20 tablet   0   . FLUTICASONE PROPIONATE 50 MCG/ACT NA SUSP   Nasal   Place 2 sprays into the nose as needed.         Marland Kitchen HYDROCODONE-ACETAMINOPHEN 5-325 MG PO TABS   Oral   Take 1-2 tablets by mouth every 6  (six) hours as needed for pain.   14 tablet   0   . LEVALBUTEROL HCL 0.63 MG/3ML IN NEBU   Nebulization   Take 1 ampule by nebulization every 4 (four) hours as needed.         Marland Kitchen LINACLOTIDE 145 MCG PO CAPS   Oral   Take 1 capsule by mouth daily.   30 capsule   0   . PROMETHAZINE VC/CODEINE 6.25-5-10 MG/5ML PO SYRP               . VENTOLIN HFA 108 (90 BASE) MCG/ACT IN AERS               . XOPENEX HFA 45 MCG/ACT IN AERO                 Triage Vitals: BP 117/80  Pulse 91  Temp 98.1 F (36.7 C) (Oral)  Resp 16  Ht 5\' 8"  (1.727 m)  Wt 220 lb (99.791 kg)  BMI 33.45 kg/m2  SpO2 97%  Physical Exam  Constitutional: She is oriented to person, place, and time. She appears well-developed and well-nourished.  HENT:  Head: Normocephalic and atraumatic.  Mouth/Throat: Oropharynx is clear and moist and mucous membranes are normal. No oropharyngeal exudate.  Eyes: Conjunctivae normal and EOM are normal. Pupils are equal, round, and reactive to light. No scleral icterus.  Neck: Neck supple.  Cardiovascular: Normal rate and regular rhythm.   No murmur heard. Pulmonary/Chest: Effort normal and breath sounds normal. No respiratory distress. She has no wheezes. She has no rales.       Lungs are clear bilaterally.  Abdominal: Soft. Bowel sounds are normal. There is no tenderness.  Musculoskeletal: She exhibits no edema.       No swelling in the legs.  No muscle spasm felt in thoracic area.   Neurological: She is alert and oriented to person, place, and time.  Skin: Skin is warm and dry. No rash noted.    ED Course  Procedures (including critical care time) DIAGNOSTIC STUDIES: Oxygen Saturation is 97% on room air, adequate by my interpretation.    COORDINATION OF CARE: 7:17 PM- Patient informed of current plan for treatment and evaluation and agrees with plan at this time. Will order chest x-ray, CBC, basic metabolic panel, troponin and d-dimer.  7:45PM- Medication  orders: sodium chloride 0.9 % bolus 250 mL Once, ondansetron (ZOFRAN) injection 4 mg Once, HYDROmorphone (DILAUDID) injection 1 mg Once.    Results for orders placed during the hospital encounter of 10/05/12  CBC WITH DIFFERENTIAL      Component Value Range   WBC 7.0  4.0 - 10.5 K/uL   RBC 4.85  3.87 - 5.11 MIL/uL  Hemoglobin 14.9  12.0 - 15.0 g/dL   HCT 98.1  19.1 - 47.8 %   MCV 88.0  78.0 - 100.0 fL   MCH 30.7  26.0 - 34.0 pg   MCHC 34.9  30.0 - 36.0 g/dL   RDW 29.5  62.1 - 30.8 %   Platelets 279  150 - 400 K/uL   Neutrophils Relative 39 (*) 43 - 77 %   Neutro Abs 2.7  1.7 - 7.7 K/uL   Lymphocytes Relative 53 (*) 12 - 46 %   Lymphs Abs 3.7  0.7 - 4.0 K/uL   Monocytes Relative 7  3 - 12 %   Monocytes Absolute 0.5  0.1 - 1.0 K/uL   Eosinophils Relative 1  0 - 5 %   Eosinophils Absolute 0.1  0.0 - 0.7 K/uL   Basophils Relative 0  0 - 1 %   Basophils Absolute 0.0  0.0 - 0.1 K/uL  BASIC METABOLIC PANEL      Component Value Range   Sodium 141  135 - 145 mEq/L   Potassium 3.7  3.5 - 5.1 mEq/L   Chloride 105  96 - 112 mEq/L   CO2 25  19 - 32 mEq/L   Glucose, Bld 98  70 - 99 mg/dL   BUN 15  6 - 23 mg/dL   Creatinine, Ser 6.57  0.50 - 1.10 mg/dL   Calcium 9.3  8.4 - 84.6 mg/dL   GFR calc non Af Amer 64 (*) >90 mL/min   GFR calc Af Amer 74 (*) >90 mL/min  TROPONIN I      Component Value Range   Troponin I <0.30  <0.30 ng/mL  D-DIMER, QUANTITATIVE      Component Value Range   D-Dimer, Quant 0.34  0.00 - 0.48 ug/mL-FEU    Dg Chest 2 View  10/05/2012  *RADIOLOGY REPORT*  Clinical Data: Chest and back pain.  CHEST - 2 VIEW  Comparison: KUB 11/28/2011.  Findings: Lungs are adequately inflated without consolidation or effusion.  The cardiomediastinal silhouette is within normal. There are two dense likely calcified structures of the upper abdomen which appear to be unchanged from the prior exam.  IMPRESSION: No acute cardiopulmonary disease.   Original Report Authenticated By: Elberta Fortis, M.D.     Date: 10/05/2012  Rate: 92  Rhythm: normal sinus rhythm  QRS Axis: normal  Intervals: normal  ST/T Wave abnormalities: normal  Conduction Disutrbances:none  Narrative Interpretation:   Old EKG Reviewed: none available    1. Back pain   2. Chest pain       MDM  Chest pain now workup without any significant findings. Patient's pain is predominantly in the left thoracic back area radiates around to the left lateral anterior chest. Patient's symptoms started suddenly at 9 this morning is been constant for a couple for acute cardiac event is negative troponin is negative no acute changes on the EKG. D-dimer is negative for pulmonary embolism. Patient without hypoxia no tachycardia. No leukocytosis. No sniffing electrolyte abnormalities. Suspect it may be musculoskeletal chest pain we'll treat with pain medicine and muscle relaxer and followup with her primary care Dr.     I personally performed the services described in this documentation, which was scribed in my presence. The recorded information has been reviewed and is accurate.     Shelda Jakes, MD 10/05/12 2017

## 2012-10-05 NOTE — ED Notes (Signed)
Left upper back pain radiating round left chest to sternum since this morning.  Slight dizziness and nausea when it started.  No hx of similar pain.

## 2013-08-18 ENCOUNTER — Other Ambulatory Visit: Payer: Self-pay | Admitting: Emergency Medicine

## 2013-08-18 ENCOUNTER — Ambulatory Visit
Admission: RE | Admit: 2013-08-18 | Discharge: 2013-08-18 | Disposition: A | Discharge status: Home / Self Care | Payer: Worker's Compensation | Source: Ambulatory Visit | Attending: Emergency Medicine | Admitting: Emergency Medicine

## 2013-08-18 DIAGNOSIS — M79675 Pain in left toe(s): Secondary | ICD-10-CM

## 2013-09-15 ENCOUNTER — Other Ambulatory Visit: Payer: Self-pay | Admitting: Emergency Medicine

## 2013-09-15 ENCOUNTER — Ambulatory Visit
Admission: RE | Admit: 2013-09-15 | Discharge: 2013-09-15 | Disposition: A | Discharge status: Home / Self Care | Payer: Worker's Compensation | Source: Ambulatory Visit | Attending: Emergency Medicine | Admitting: Emergency Medicine

## 2013-09-15 DIAGNOSIS — M79675 Pain in left toe(s): Secondary | ICD-10-CM

## 2013-11-03 ENCOUNTER — Other Ambulatory Visit: Payer: Self-pay | Admitting: Internal Medicine

## 2013-11-03 DIAGNOSIS — M7989 Other specified soft tissue disorders: Secondary | ICD-10-CM

## 2013-11-06 ENCOUNTER — Ambulatory Visit
Admission: RE | Admit: 2013-11-06 | Discharge: 2013-11-06 | Disposition: A | Discharge status: Home / Self Care | Payer: 59 | Source: Ambulatory Visit | Attending: Internal Medicine | Admitting: Internal Medicine

## 2013-11-06 DIAGNOSIS — M7989 Other specified soft tissue disorders: Secondary | ICD-10-CM

## 2013-11-06 MED ORDER — IOHEXOL 300 MG/ML  SOLN
125.0000 mL | Freq: Once | INTRAMUSCULAR | Status: AC | PRN
  Administered 2013-11-06: 125 mL via INTRAVENOUS

## 2014-04-13 ENCOUNTER — Emergency Department (HOSPITAL_BASED_OUTPATIENT_CLINIC_OR_DEPARTMENT_OTHER): Payer: 59

## 2014-04-13 ENCOUNTER — Encounter (HOSPITAL_BASED_OUTPATIENT_CLINIC_OR_DEPARTMENT_OTHER): Payer: Self-pay | Admitting: Emergency Medicine

## 2014-04-13 ENCOUNTER — Emergency Department (HOSPITAL_BASED_OUTPATIENT_CLINIC_OR_DEPARTMENT_OTHER)
Admission: EM | Admit: 2014-04-13 | Discharge: 2014-04-13 | Disposition: A | Discharge status: Home / Self Care | Payer: 59 | Attending: Emergency Medicine | Admitting: Emergency Medicine

## 2014-04-13 DIAGNOSIS — F17200 Nicotine dependence, unspecified, uncomplicated: Secondary | ICD-10-CM | POA: Insufficient documentation

## 2014-04-13 DIAGNOSIS — K5900 Constipation, unspecified: Secondary | ICD-10-CM | POA: Insufficient documentation

## 2014-04-13 DIAGNOSIS — IMO0002 Reserved for concepts with insufficient information to code with codable children: Secondary | ICD-10-CM | POA: Insufficient documentation

## 2014-04-13 DIAGNOSIS — R079 Chest pain, unspecified: Secondary | ICD-10-CM

## 2014-04-13 DIAGNOSIS — R072 Precordial pain: Secondary | ICD-10-CM | POA: Insufficient documentation

## 2014-04-13 DIAGNOSIS — Z79899 Other long term (current) drug therapy: Secondary | ICD-10-CM | POA: Insufficient documentation

## 2014-04-13 DIAGNOSIS — K589 Irritable bowel syndrome without diarrhea: Secondary | ICD-10-CM | POA: Insufficient documentation

## 2014-04-13 DIAGNOSIS — M7989 Other specified soft tissue disorders: Secondary | ICD-10-CM | POA: Insufficient documentation

## 2014-04-13 LAB — BASIC METABOLIC PANEL
BUN: 13 mg/dL (ref 6–23)
CALCIUM: 9.1 mg/dL (ref 8.4–10.5)
CO2: 27 mEq/L (ref 19–32)
CREATININE: 0.7 mg/dL (ref 0.50–1.10)
Chloride: 105 mEq/L (ref 96–112)
GLUCOSE: 122 mg/dL — AB (ref 70–99)
POTASSIUM: 3.9 meq/L (ref 3.7–5.3)
Sodium: 142 mEq/L (ref 137–147)

## 2014-04-13 LAB — CBC WITH DIFFERENTIAL/PLATELET
BASOS PCT: 0 % (ref 0–1)
Basophils Absolute: 0 10*3/uL (ref 0.0–0.1)
EOS ABS: 0.1 10*3/uL (ref 0.0–0.7)
EOS PCT: 1 % (ref 0–5)
HCT: 41.9 % (ref 36.0–46.0)
HEMOGLOBIN: 14.1 g/dL (ref 12.0–15.0)
LYMPHS ABS: 3.4 10*3/uL (ref 0.7–4.0)
Lymphocytes Relative: 45 % (ref 12–46)
MCH: 31.1 pg (ref 26.0–34.0)
MCHC: 33.7 g/dL (ref 30.0–36.0)
MCV: 92.3 fL (ref 78.0–100.0)
MONO ABS: 0.6 10*3/uL (ref 0.1–1.0)
MONOS PCT: 7 % (ref 3–12)
NEUTROS PCT: 46 % (ref 43–77)
Neutro Abs: 3.4 10*3/uL (ref 1.7–7.7)
Platelets: 273 10*3/uL (ref 150–400)
RBC: 4.54 MIL/uL (ref 3.87–5.11)
RDW: 14.9 % (ref 11.5–15.5)
WBC: 7.5 10*3/uL (ref 4.0–10.5)

## 2014-04-13 LAB — TROPONIN I: Troponin I: <0.3 ng/mL (ref <0.30)

## 2014-04-13 MED ORDER — NAPROXEN 375 MG PO TABS: 375.0000 mg | ORAL_TABLET | Freq: Two times a day (BID) | ORAL | Status: DC

## 2014-04-13 NOTE — ED Notes (Signed)
MD at bedside. 

## 2014-04-13 NOTE — ED Notes (Addendum)
Onset of mid sternal chest pain described as tightness that started at 0300 this morning. Not sure if this pain awakened her.  Pain 9/10 with movement at the worst and 5/10 at rest. Weakness in left arm that lasted a few minutes and resolved now. Aspirin 162mg  PTA

## 2014-04-13 NOTE — Discharge Instructions (Signed)

## 2014-04-13 NOTE — ED Provider Notes (Signed)
CSN: [id number]     Arrival date & time 04/13/14  2694 History   First MD Initiated Contact with Patient 04/13/14 [phone]     Chief Complaint  Patient presents with  . Chest Pain     (Consider location/radiation/quality/duration/timing/severity/associated sxs/prior Treatment) HPI Comments: Patient presents with chest pain. She states that when she woke up about 3 AM she was having pain in the lower center part of her chest. It's been constant since then. It's worse when she tries to move such as sitting up. She's not moving it completely goes away. She denies any shortness of breath, nausea or diaphoresis. She's not taking anything for the pain. At one point when it first started she had a brief episode of weakness in her left arm although she did not have any pain or numbness to her. She said that lasted for minute or 2 and then went away. She denies any nausea or vomiting. She denies any history of heart problems in the past. She has a family history of heart disease in her mother and her brother in her late 44s and early 50s. She is a smoker. She has some mild swelling of both legs which is unchanged from her baseline.  Patient is a 53 y.o. female presenting with chest pain.  Chest Pain Associated symptoms: no abdominal pain, no back pain, no cough, no diaphoresis, no dizziness, no fatigue, no fever, no headache, no nausea, no numbness, not vomiting and no weakness     Past Medical History  Diagnosis Date  . Dysphagia, oral phase   . Family history of malignant neoplasm of gastrointestinal tract   . Unspecified vitamin D deficiency   . Hiatal hernia   . Esophageal reflux   . Irritable bowel syndrome   . Constipation    Past Surgical History  Procedure Laterality Date  . Abdominal hysterectomy    . Rectovaginal fistula closure    . Ileostomy    . Shoulder surgery    . Colonoscopy    . Esophageal dilation     Family History  Problem Relation Age of Onset  . Colon cancer Mother    . Hypertension Mother   . Diabetes Mother   . Coronary artery disease Mother    History  Substance Use Topics  . Smoking status: Current Every Day Smoker -- 0.50 packs/day    Types: Cigarettes  . Smokeless tobacco: Never Used  . Alcohol Use: Yes     Comment: occ.   OB History   Grav Para Term Preterm Abortions TAB SAB Ect Mult Living                 Review of Systems  Constitutional: Negative for fever, chills, diaphoresis and fatigue.  HENT: Negative for congestion, rhinorrhea and sneezing.   Eyes: Negative.   Respiratory: Negative for cough and chest tightness.   Cardiovascular: Positive for chest pain and leg swelling.  Gastrointestinal: Negative for nausea, vomiting, abdominal pain, diarrhea and blood in stool.  Genitourinary: Negative for frequency, hematuria, flank pain and difficulty urinating.  Musculoskeletal: Negative for arthralgias and back pain.  Skin: Negative for rash.  Neurological: Negative for dizziness, speech difficulty, weakness, numbness and headaches.      Allergies  Review of patient's allergies indicates no known allergies.  Home Medications   Prior to Admission medications   Medication Sig Start Date End Date Taking? Authorizing Rocket Gunderson  clidinium-chlordiazePOXIDE (LIBRAX) 2.5-5 MG per capsule Take 1 capsule by mouth 4 (four) times daily -  before meals and at bedtime. 12/21/11 12/20/12  Sable Feil, MD  cyclobenzaprine (FLEXERIL) 10 MG tablet Take 1 tablet (10 mg total) by mouth 2 (two) times daily as needed for muscle spasms. 10/05/12   Fredia Sorrow, MD  fluticasone (FLONASE) 50 MCG/ACT nasal spray Place 2 sprays into the nose as needed.    Historical Mauri Tolen, MD  HYDROcodone-acetaminophen (NORCO/VICODIN) 5-325 MG per tablet Take 1-2 tablets by mouth every 6 (six) hours as needed for pain. 10/05/12   Fredia Sorrow, MD  levalbuterol Penne Lash) 0.63 MG/3ML nebulizer solution Take 1 ampule by nebulization every 4 (four) hours as needed.     Historical Dereona Kolodny, MD  Linaclotide Rolan Lipa) 145 MCG CAPS Take 1 capsule by mouth daily. 12/13/11   Sable Feil, MD  naproxen (NAPROSYN) 375 MG tablet Take 1 tablet (375 mg total) by mouth 2 (two) times daily. 04/13/14   Malvin Johns, MD  PROMETHAZINE VC/CODEINE 6.25-5-10 MG/5ML SYRP  11/23/11   Historical Brenisha Tsui, MD  VENTOLIN HFA 108 (90 BASE) MCG/ACT inhaler  11/14/11   Historical Angellina Ferdinand, MD  XOPENEX HFA 45 MCG/ACT inhaler  11/14/11   Historical Merida Alcantar, MD   BP 127/67  Pulse 84  Temp(Src) 98.9 F (37.2 C) (Oral)  Resp 16  Ht 5\' 8"  (1.727 m)  Wt 220 lb (99.791 kg)  BMI 33.46 kg/m2  SpO2 100% Physical Exam  Constitutional: She is oriented to person, place, and time. She appears well-developed and well-nourished.  HENT:  Head: Normocephalic and atraumatic.  Eyes: Pupils are equal, round, and reactive to light.  Neck: Normal range of motion. Neck supple.  Cardiovascular: Normal rate, regular rhythm and normal heart sounds.   Pulmonary/Chest: Effort normal and breath sounds normal. No respiratory distress. She has no wheezes. She has no rales. She exhibits tenderness (Positive tenderness to the lower sternum. It's reproducible with palpation).  Abdominal: Soft. Bowel sounds are normal. There is no tenderness. There is no rebound and no guarding.  Musculoskeletal: Normal range of motion. She exhibits no edema.  Lymphadenopathy:    She has no cervical adenopathy.  Neurological: She is alert and oriented to person, place, and time.  Skin: Skin is warm and dry. No rash noted.  Psychiatric: She has a normal mood and affect.    ED Course  Procedures (including critical care time) Labs Review Results for orders placed during the hospital encounter of 04/13/14  CBC WITH DIFFERENTIAL      Result Value Ref Range   WBC 7.5  4.0 - 10.5 K/uL   RBC 4.54  3.87 - 5.11 MIL/uL   Hemoglobin 14.1  12.0 - 15.0 g/dL   HCT 41.9  36.0 - 46.0 %   MCV 92.3  78.0 - 100.0 fL   MCH 31.1  26.0 -  34.0 pg   MCHC 33.7  30.0 - 36.0 g/dL   RDW 14.9  11.5 - 15.5 %   Platelets 273  150 - 400 K/uL   Neutrophils Relative % 46  43 - 77 %   Neutro Abs 3.4  1.7 - 7.7 K/uL   Lymphocytes Relative 45  12 - 46 %   Lymphs Abs 3.4  0.7 - 4.0 K/uL   Monocytes Relative 7  3 - 12 %   Monocytes Absolute 0.6  0.1 - 1.0 K/uL   Eosinophils Relative 1  0 - 5 %   Eosinophils Absolute 0.1  0.0 - 0.7 K/uL   Basophils Relative 0  0 - 1 %   Basophils Absolute  0.0  0.0 - 0.1 K/uL  BASIC METABOLIC PANEL      Result Value Ref Range   Sodium 142  137 - 147 mEq/L   Potassium 3.9  3.7 - 5.3 mEq/L   Chloride 105  96 - 112 mEq/L   CO2 27  19 - 32 mEq/L   Glucose, Bld 122 (*) 70 - 99 mg/dL   BUN 13  6 - 23 mg/dL   Creatinine, Ser 0.70  0.50 - 1.10 mg/dL   Calcium 9.1  8.4 - 10.5 mg/dL   GFR calc non Af Amer >90  >90 mL/min   GFR calc Af Amer >90  >90 mL/min  TROPONIN I      Result Value Ref Range   Troponin I <0.30  <0.30 ng/mL   Dg Chest 2 View  04/13/2014   CLINICAL DATA:  Mid anterior chest pain.  EXAM: CHEST  2 VIEW  COMPARISON:  10/05/2012.  FINDINGS: Cardiopericardial silhouette within normal limits. Mediastinal contours normal. Trachea midline. No airspace disease or effusion. Calcified granuloma in the left apex. Calcifications are present in the upper abdomen which appear unchanged, compared to prior exam compatible with calcifying lymph nodes. In conjunction with the prior CT, the findings are consistent with old granulomatous disease.  IMPRESSION: 1. No active cardiopulmonary disease. 2. Old granulomatous disease.   Electronically Signed   By: Dereck Ligas M.D.   On: 04/13/2014 08:17      Imaging Review Dg Chest 2 View  04/13/2014   CLINICAL DATA:  Mid anterior chest pain.  EXAM: CHEST  2 VIEW  COMPARISON:  10/05/2012.  FINDINGS: Cardiopericardial silhouette within normal limits. Mediastinal contours normal. Trachea midline. No airspace disease or effusion. Calcified granuloma in the left apex.  Calcifications are present in the upper abdomen which appear unchanged, compared to prior exam compatible with calcifying lymph nodes. In conjunction with the prior CT, the findings are consistent with old granulomatous disease.  IMPRESSION: 1. No active cardiopulmonary disease. 2. Old granulomatous disease.   Electronically Signed   By: Dereck Ligas M.D.   On: 04/13/2014 08:17     EKG Interpretation   Date/Time:  Tuesday April 13 2014 07:04:09 EDT Ventricular Rate:  89 PR Interval:  158 QRS Duration: 86 QT Interval:  356 QTC Calculation: 433 R Axis:   84 Text Interpretation:  Normal sinus rhythm Biatrial enlargement Cannot rule  out Anterior infarct , age undetermined Abnormal ECG since last tracing no  significant change Confirmed by BELFI  MD, MELANIE ([id number]) on 04/13/2014  9:00:21 AM      MDM   Final diagnoses:  Chest pain, unspecified chest pain type    Patient presents with chest pain. This seems to be chest wall in nature. It's reproducible on palpation in that it's worse with certain movements and positions. She has no shortness of breath or pleuritic-type pain. Her oxygen saturation is her 100% and she has no other indications of pulmonary embolus. Her symptoms aren't consistent with acute coronary syndrome. She has no ischemic changes on EKG in her troponin is negative. I will discharge her home and have her followup with her primary care physician for recheck.    Malvin Johns, MD 04/13/14 [phone]

## 2014-06-08 ENCOUNTER — Encounter: Payer: Self-pay | Admitting: Gastroenterology

## 2014-09-14 ENCOUNTER — Encounter: Payer: Self-pay | Admitting: Internal Medicine

## 2014-09-14 ENCOUNTER — Ambulatory Visit (INDEPENDENT_AMBULATORY_CARE_PROVIDER_SITE_OTHER): Payer: 59 | Admitting: Internal Medicine

## 2014-09-14 VITALS — BP 120/70 | HR 83 | Ht 68.0 in | Wt 222.6 lb

## 2014-09-14 DIAGNOSIS — K5900 Constipation, unspecified: Secondary | ICD-10-CM

## 2014-09-14 DIAGNOSIS — K5909 Other constipation: Secondary | ICD-10-CM

## 2014-09-14 DIAGNOSIS — K589 Irritable bowel syndrome without diarrhea: Secondary | ICD-10-CM

## 2014-09-14 MED ORDER — POLYETHYLENE GLYCOL 3350 17 GM/SCOOP PO POWD: 17.0000 g | Freq: Two times a day (BID) | ORAL | Status: DC

## 2014-09-14 MED ORDER — SENNA 8.6 MG PO TABS: ORAL_TABLET | ORAL | Status: DC

## 2014-09-14 NOTE — Progress Notes (Signed)
   Subjective:    Patient ID: Gina [name], female    DOB: 14-Jan-1961, 53 y.o.   MRN: [id number]  HPI Years of chronic constipation. 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 Constipation preceded this. She has been worse lately - but finally cleared with MOM, cit MG and Sneokot. Colonoscopy 2013 negative Never tried Linzess as Rxed by dr. Sharlett Iles "side effects scared me" Medications, allergies, past medical history, past surgical history, family history and social history are reviewed and updated in the EMR.   Review of Systems As above    Objective:   Physical Exam Obese bw NAD Eyes anicteric abd soft and nontender, + surgical scars Gina [name], CMA present.   Anoderm inspection revealed small tags Anal wink was absent Digital exam revealed normal resting tone and voluntary squeeze. No mass or rectocele present. Simulated defecation with valsalva revealed appropriate abdominal contraction and descent.          Assessment & Plan:  Chronic constipation Try MiraLax bid with intermittent Senokot If that does not help will need to consider Sitxmarks and anorectal manometry vs going to Linzess - she is to call back 4-6 weeks with update if not working to satisfaction Rectal exam today NL except no anal wink Hx suggests outlet problem  IBS Probable overlap

## 2014-09-14 NOTE — Assessment & Plan Note (Signed)
Probable overlap

## 2014-09-14 NOTE — Assessment & Plan Note (Addendum)
Try MiraLax bid with intermittent Senokot If that does not help will need to consider Sitzmarks and anorectal manometry vs going to Linzess - she is to call back 4-6 weeks with update if not working to satisfaction Rectal exam today NL except no anal wink Hx suggests outlet problem

## 2014-09-14 NOTE — Patient Instructions (Addendum)
Please try the Miralax daily .  Coupon provided.  Rx sent in as well to see if insurance may cover.  Take 1 to 2 Senokot at bedtime if no bowel movement in 2 to 3 days.   Try this for 4 to 6 weeks consistently to see if it helps.  Call us back if you need more help.   I appreciate the opportunity to care for you.

## 2014-09-20 ENCOUNTER — Encounter: Payer: Self-pay | Admitting: Internal Medicine

## 2015-05-31 ENCOUNTER — Other Ambulatory Visit: Payer: Self-pay

## 2015-05-31 DIAGNOSIS — Z1231 Encounter for screening mammogram for malignant neoplasm of breast: Secondary | ICD-10-CM

## 2015-07-11 ENCOUNTER — Ambulatory Visit
Admission: RE | Admit: 2015-07-11 | Discharge: 2015-07-11 | Disposition: A | Discharge status: Home / Self Care | Payer: Commercial Managed Care - HMO | Source: Ambulatory Visit

## 2015-07-11 DIAGNOSIS — Z1231 Encounter for screening mammogram for malignant neoplasm of breast: Secondary | ICD-10-CM

## 2015-11-18 ENCOUNTER — Telehealth: Payer: Self-pay | Admitting: Internal Medicine

## 2015-11-18 NOTE — Telephone Encounter (Signed)
Hasn't been seen since 2015, please advise Sir, thank you.

## 2015-11-21 NOTE — Telephone Encounter (Signed)
Spoke with patient and made appointment for 12/20/15 at 3:15pm with Dr Carlean Purl.

## 2015-11-21 NOTE — Telephone Encounter (Signed)
She would need an office visit to review her situation

## 2015-12-20 ENCOUNTER — Encounter: Payer: Self-pay | Admitting: Internal Medicine

## 2015-12-20 ENCOUNTER — Ambulatory Visit (INDEPENDENT_AMBULATORY_CARE_PROVIDER_SITE_OTHER): Payer: Commercial Managed Care - HMO | Admitting: Internal Medicine

## 2015-12-20 VITALS — BP 92/60 | HR 72 | Ht 68.75 in | Wt 221.0 lb

## 2015-12-20 DIAGNOSIS — Z800 Family history of malignant neoplasm of digestive organs: Secondary | ICD-10-CM

## 2015-12-20 DIAGNOSIS — K5900 Constipation, unspecified: Secondary | ICD-10-CM | POA: Diagnosis not present

## 2015-12-20 DIAGNOSIS — K5909 Other constipation: Secondary | ICD-10-CM

## 2015-12-20 NOTE — Progress Notes (Signed)
   Subjective:    Patient ID: Gina [name], female    DOB: Oct 13, 1961, 55 y.o.   MRN: [id number] Cc: constipatipion HPI Here for f/u chronic constipation - has had a rectovaginal fistula repair - last seen 2015. Took MiraLAx as recommended - then stopped. Started having difficult with infrequent defecation q 1 week - took some laxatives, restarted MiraLAx 2 doses/day and now successful fairly regular bowel movements and is satisfied w/ quality of live. Stools are "squiggly" on MiraLAx.  Medications, allergies, past medical history, past surgical history, family history and social history are reviewed and updated in the EMR.  Review of Systems As above    Objective:   Physical Exam  BP 92/60 mmHg  Pulse 72  Ht 5' 8.75" (1.746 m)  Wt 221 lb (100.245 kg)  BMI 32.88 kg/m2    Assessment & Plan:  Chronic constipation Continue MiraLax RTC prn Hold anorectal manometry in reserve  Family history of colon cancer mother - 39's Next colonoscopy 11/2016 approx - q 5 yrs   15 minutes time spent with patient > half in counseling coordination of care

## 2015-12-20 NOTE — Assessment & Plan Note (Signed)
Next colonoscopy 11/2016 approx - q 5 yrs

## 2015-12-20 NOTE — Patient Instructions (Signed)
  Continue your Miralax.   Follow up with [place] as needed.    We are putting you in for a colon recall of Feb. 2018.    I appreciate the opportunity to care for you. Silvano Rusk, MD, [facility]

## 2015-12-20 NOTE — Assessment & Plan Note (Signed)
Continue MiraLax RTC prn Hold anorectal manometry in reserve

## 2016-06-06 ENCOUNTER — Other Ambulatory Visit: Payer: Self-pay | Admitting: Acute Care

## 2016-06-06 DIAGNOSIS — F17210 Nicotine dependence, cigarettes, uncomplicated: Secondary | ICD-10-CM

## 2016-07-11 ENCOUNTER — Ambulatory Visit (INDEPENDENT_AMBULATORY_CARE_PROVIDER_SITE_OTHER)
Admission: RE | Admit: 2016-07-11 | Discharge: 2016-07-11 | Disposition: A | Discharge status: Home / Self Care | Payer: Commercial Managed Care - HMO | Source: Ambulatory Visit | Attending: Acute Care | Admitting: Acute Care

## 2016-07-11 ENCOUNTER — Ambulatory Visit (INDEPENDENT_AMBULATORY_CARE_PROVIDER_SITE_OTHER): Payer: Commercial Managed Care - HMO | Admitting: Acute Care

## 2016-07-11 ENCOUNTER — Encounter: Payer: Self-pay | Admitting: Acute Care

## 2016-07-11 DIAGNOSIS — Z87891 Personal history of nicotine dependence: Secondary | ICD-10-CM

## 2016-07-11 DIAGNOSIS — F17210 Nicotine dependence, cigarettes, uncomplicated: Secondary | ICD-10-CM

## 2016-07-11 NOTE — Progress Notes (Signed)
Shared Decision Making Visit Lung Cancer Screening Program [phone])   Eligibility:  Age 55 y.o.  Pack Years Smoking History Calculation 30+ (# packs/per year x # years smoked)  Recent History of coughing up blood  no  Unexplained weight loss? no ( >Than 15 pounds within the last 6 months )  Prior History Lung / other cancer no (Diagnosis within the last 5 years already requiring surveillance chest CT Scans).  Smoking Status Current Smoker  Former Smokers: Years since quit: NA  Quit Date: NA  Visit Components:  Discussion included one or more decision making aids. yes  Discussion included risk/benefits of screening. yes  Discussion included potential follow up diagnostic testing for abnormal scans. yes  Discussion included meaning and risk of over diagnosis. yes  Discussion included meaning and risk of False Positives. yes  Discussion included meaning of total radiation exposure. yes  Counseling Included:  Importance of adherence to annual lung cancer LDCT screening. yes  Impact of comorbidities on ability to participate in the program. yes  Ability and willingness to under diagnostic treatment. yes  Smoking Cessation Counseling:  Current Smokers:   Discussed importance of smoking cessation. yes  Information about tobacco cessation classes and interventions provided to patient. yes  Patient provided with "ticket" for LDCT Scan. yes  Symptomatic Patient. no  Counseling  Diagnosis Code: Tobacco Use Z72.0  Asymptomatic Patient yes  Counseling (Intermediate counseling: > three minutes counseling) [id number]  Former Smokers:   Discussed the importance of maintaining cigarette abstinence. yes  Diagnosis Code: Personal History of Nicotine Dependence. [id number]  Information about tobacco cessation classes and interventions provided to patient. Yes  Patient provided with "ticket" for LDCT Scan. yes  Written Order for Lung Cancer Screening with LDCT placed in  Epic. Yes (CT Chest Lung Cancer Screening Low Dose W/O CM) [id number] Z12.2-Screening of respiratory organs Z87.891-Personal history of nicotine dependence  I have spent 20 minutes of face to face time with Mrs. [place]  discussing the risks and benefits of lung cancer screening. We viewed a power point together that explained in detail the above noted topics. We paused at intervals to allow for questions to be asked and answered to ensure understanding.We discussed that the single most powerful action that she can take to decrease her risk of developing lung cancer is to quit smoking. We discussed whether or not she is ready to commit to setting a quit date. We discussed options for tools to aid in quitting smoking including nicotine replacement therapy, non-nicotine medications, support groups, Quit Smart classes, and behavior modification. We discussed that often times setting smaller, more achievable goals, such as eliminating 1 cigarette a day for a week and then 2 cigarettes a day for a week can be helpful in slowly decreasing the number of cigarettes smoked. This allows for a sense of accomplishment as well as providing a clinical benefit. I gave her the " Be Stronger Than Your Excuses" card with contact information for community resources, classes, free nicotine replacement therapy, and access to mobile apps, text messaging, and on-line smoking cessation help. I have also given her my card and contact information in the event she needs to contact me. We discussed the time and location of the scan, and that either June Leap, CMA, or I will call with the results within 24-48 hours of receiving them. I have provided her with a copy of the power point we viewed  as a resource in the event they need reinforcement of the concepts we  discussed today in the office. The patient verbalized understanding of all of  the above and had no further questions upon leaving the office. They have my contact information in  the event they have any further questions.  Magdalen Spatz, NP 07/11/2016

## 2016-07-12 ENCOUNTER — Telehealth: Payer: Self-pay | Admitting: Acute Care

## 2016-07-12 DIAGNOSIS — F17210 Nicotine dependence, cigarettes, uncomplicated: Secondary | ICD-10-CM

## 2016-07-12 NOTE — Telephone Encounter (Signed)
I have attempted to call Gina [name] with the results of her low-dose CT screening. Per our conversation yesterday during the shared decision-making visit she requests leave a message on her cell phone. I left a message indicating to her that her scan was read as a Lung RADS 2: nodules that are benign in appearance and behavior with a very low likelihood of becoming a clinically active cancer due to size or lack of growth. Recommendation per radiology is for a repeat LDCT in 12 months. Incidental findings noted on the exam included mild age advanced coronary artery atherosclerosis in the left anterior descending coronary artery, and sequela of granulomatous disease. I will fax a copy of the report to Dr. Deland Pretty, the patient's primary care physician for follow-up as he feels is clinically indicated. I have left my name and contact number with the message for Gina [name], and I requested that she call me back if she had any questions or concerns regarding the reading of her scan. Her annual scan will be ordered and scheduled for September 2018

## 2016-12-03 ENCOUNTER — Encounter: Payer: Self-pay | Admitting: Internal Medicine

## 2016-12-18 DIAGNOSIS — M7751 Other enthesopathy of right foot: Secondary | ICD-10-CM | POA: Diagnosis not present

## 2016-12-18 DIAGNOSIS — M7752 Other enthesopathy of left foot: Secondary | ICD-10-CM | POA: Diagnosis not present

## 2017-01-27 ENCOUNTER — Emergency Department (HOSPITAL_BASED_OUTPATIENT_CLINIC_OR_DEPARTMENT_OTHER)
Admission: EM | Admit: 2017-01-27 | Discharge: 2017-01-27 | Disposition: A | Discharge status: Home / Self Care | Payer: Commercial Managed Care - HMO | Attending: Emergency Medicine | Admitting: Emergency Medicine

## 2017-01-27 ENCOUNTER — Encounter (HOSPITAL_BASED_OUTPATIENT_CLINIC_OR_DEPARTMENT_OTHER): Payer: Self-pay | Admitting: *Deleted

## 2017-01-27 DIAGNOSIS — F17210 Nicotine dependence, cigarettes, uncomplicated: Secondary | ICD-10-CM | POA: Insufficient documentation

## 2017-01-27 DIAGNOSIS — X58XXXA Exposure to other specified factors, initial encounter: Secondary | ICD-10-CM | POA: Insufficient documentation

## 2017-01-27 DIAGNOSIS — Z79899 Other long term (current) drug therapy: Secondary | ICD-10-CM | POA: Diagnosis not present

## 2017-01-27 DIAGNOSIS — S39012A Strain of muscle, fascia and tendon of lower back, initial encounter: Secondary | ICD-10-CM | POA: Insufficient documentation

## 2017-01-27 DIAGNOSIS — Y999 Unspecified external cause status: Secondary | ICD-10-CM | POA: Insufficient documentation

## 2017-01-27 DIAGNOSIS — M545 Low back pain, unspecified: Secondary | ICD-10-CM

## 2017-01-27 DIAGNOSIS — Y939 Activity, unspecified: Secondary | ICD-10-CM | POA: Insufficient documentation

## 2017-01-27 DIAGNOSIS — T148XXA Other injury of unspecified body region, initial encounter: Secondary | ICD-10-CM

## 2017-01-27 DIAGNOSIS — Y929 Unspecified place or not applicable: Secondary | ICD-10-CM | POA: Diagnosis not present

## 2017-01-27 LAB — URINALYSIS, ROUTINE W REFLEX MICROSCOPIC
Glucose, UA: NEGATIVE mg/dL
Hgb urine dipstick: NEGATIVE
Ketones, ur: NEGATIVE mg/dL
Leukocytes, UA: NEGATIVE
Nitrite: NEGATIVE
Protein, ur: NEGATIVE mg/dL
Specific Gravity, Urine: 1.03 (ref 1.005–1.030)
pH: 5.5 (ref 5.0–8.0)

## 2017-01-27 MED ORDER — IBUPROFEN 400 MG PO TABS
400.0000 mg | ORAL_TABLET | Freq: Once | ORAL | Status: AC
  Administered 2017-01-27: 400 mg via ORAL
  Filled 2017-01-27: qty 1

## 2017-01-27 MED ORDER — CYCLOBENZAPRINE HCL 10 MG PO TABS: 10.0000 mg | ORAL_TABLET | Freq: Two times a day (BID) | ORAL | 0 refills | Status: DC | PRN

## 2017-01-27 MED ORDER — CYCLOBENZAPRINE HCL 5 MG PO TABS
5.0000 mg | ORAL_TABLET | Freq: Once | ORAL | Status: AC
  Administered 2017-01-27: 5 mg via ORAL
  Filled 2017-01-27: qty 1

## 2017-01-27 MED ORDER — IBUPROFEN 600 MG PO TABS: 600.0000 mg | ORAL_TABLET | Freq: Four times a day (QID) | ORAL | 0 refills | Status: DC | PRN

## 2017-01-27 MED ORDER — METHOCARBAMOL 500 MG PO TABS
750.0000 mg | ORAL_TABLET | Freq: Once | ORAL | Status: DC
  Filled 2017-01-27: qty 2

## 2017-01-27 NOTE — Discharge Instructions (Signed)
Please read and follow all provided instructions.  Your diagnoses today include:  1. Acute left-sided low back pain without sciatica   2. Muscle strain     Tests performed today include: Vital signs - see below for your results today  Medications prescribed:   Take any prescribed medications only as directed.  Home care instructions:  Follow any educational materials contained in this packet Please rest, use ice or heat on your back for the next several days Do not lift, push, pull anything more than 10 pounds for the next week  Follow-up instructions: Please follow-up with your primary care provider in the next 1 week for further evaluation of your symptoms.   Return instructions:  SEEK IMMEDIATE MEDICAL ATTENTION IF YOU HAVE: New numbness, tingling, weakness, or problem with the use of your arms or legs Severe back pain not relieved with medications Loss control of your bowels or bladder Increasing pain in any areas of the body (such as chest or abdominal pain) Shortness of breath, dizziness, or fainting.  Worsening nausea (feeling sick to your stomach), vomiting, fever, or sweats Any other emergent concerns regarding your health   Additional Information:  Your vital signs today were: BP 106/69 (BP Location: Right Arm)    Pulse 82    Temp 97.9 F (36.6 C) (Oral)    Resp 18    Ht 5\' 9"  (1.753 m)    Wt 101.6 kg    SpO2 100%    BMI 33.08 kg/m  If your blood pressure (BP) was elevated above 135/85 this visit, please have this repeated by your doctor within one month. --------------

## 2017-01-27 NOTE — ED Triage Notes (Signed)
Pt reports L back/flank pain that developed this past Friday. Denies fever, n/v/d, known injury. Reports discolored urine this morning but no dysuria, frequency, urgency.

## 2017-01-27 NOTE — ED Provider Notes (Signed)
[name] Provider Note   CSN: [id number] Arrival date & time: 01/27/17  0836     History   Chief Complaint Chief Complaint  Patient presents with  . Flank Pain    HPI Gina [name] is a 56 y.o. female.  HPI  56 y.o. female, presents to the Emergency Department today complaining of left flank pain that developed on Friday. No N/V/D. No fevers. No known mechanism of injury. No dysuria. No chills. States pain is intermittent. Improves with rest. Worsens with positioning. Isolated to left low back/flank. No midlines spinous tenderness. No loss of bowel or bladder function. No saddle anesthesia. No numbness/tingling. Has taken Alleve the moderate effect. No other symptoms noted.     Past Medical History:  Diagnosis Date  . Constipation   . Dysphagia, oral phase   . Esophageal reflux   . Family history of malignant neoplasm of gastrointestinal tract   . Hiatal hernia   . Irritable bowel syndrome   . Unspecified vitamin D deficiency     Patient Active Problem List   Diagnosis Date Noted  . Abdominal pain, right lower quadrant 12/17/2011  . Personal history of colonic polyps 12/17/2011  . Family history of colon cancer mother - 61's 12/13/2011  . Chronic constipation 12/13/2011  . H/O resection of large bowel 12/13/2011  . Cervical spondylosis 12/13/2011  . CALLUS, FOOT 03/25/2009  . FOOT PAIN, BILATERAL 03/25/2009  . FIBROIDS, UTERUS 08/25/2008  . TOBACCO ABUSE 08/25/2008  . GERD 08/25/2008  . HIATAL HERNIA 08/25/2008  . IBS 08/25/2008    Past Surgical History:  Procedure Laterality Date  . ABDOMINAL HYSTERECTOMY    . COLONOSCOPY  multiple  . ESOPHAGEAL DILATION    . ILEOSTOMY    . RECTOVAGINAL FISTULA CLOSURE    . SHOULDER SURGERY      OB History    No data available       Home Medications    Prior to Admission medications   Medication Sig Start Date End Date Taking? Authorizing Provider  fluticasone (FLONASE) 50 MCG/ACT nasal  spray Place 2 sprays into the nose as needed. Reported on 12/20/2015   Yes Historical Provider, MD  polyethylene glycol powder (MIRALAX) powder Take 17 g by mouth 2 (two) times daily. 09/14/14  Yes Gatha Mayer, MD    Family History Family History  Problem Relation Age of Onset  . Hypertension Mother   . Diabetes Mother   . Coronary artery disease Mother   . Colon cancer Mother     81's    Social History Social History  Substance Use Topics  . Smoking status: Current Every Day Smoker    Packs/day: 1.00    Years: 30.00    Types: Cigarettes  . Smokeless tobacco: Never Used  . Alcohol use Yes     Comment: occ.     Allergies   Sulfa antibiotics   Review of Systems Review of Systems ROS reviewed and all are negative for acute change except as noted in the HPI.   Physical Exam Updated Vital Signs BP 106/69 (BP Location: Right Arm)   Pulse 82   Temp 97.9 F (36.6 C) (Oral)   Resp 18   Ht 5\' 9"  (1.753 m)   Wt 101.6 kg   SpO2 100%   BMI 33.08 kg/m   Physical Exam  Constitutional: She is oriented to person, place, and time. Vital signs are normal. She appears well-developed and well-nourished.  HENT:  Head: Normocephalic and atraumatic.  Right Ear: Hearing normal.  Left Ear: Hearing normal.  Eyes: Conjunctivae and EOM are normal. Pupils are equal, round, and reactive to light.  Neck: Normal range of motion. Neck supple.  Cardiovascular: Normal rate, regular rhythm, normal heart sounds and intact distal pulses.   Pulmonary/Chest: Effort normal.  Abdominal: Soft. Bowel sounds are normal. There is no tenderness. There is no rigidity, no rebound, no guarding, no CVA tenderness, no tenderness at McBurney's point and negative Murphy's sign.  Musculoskeletal: Normal range of motion.  TTP left lower lumbar musculature. No midline spinous process tenderness. No palpable or visible deformities.   Neurological: She is alert and oriented to person, place, and time.  Skin:  Skin is warm and dry.  Psychiatric: She has a normal mood and affect. Her speech is normal and behavior is normal. Thought content normal.  Nursing note and vitals reviewed.  ED Treatments / Results  Labs (all labs ordered are listed, but only abnormal results are displayed) Labs Reviewed  URINALYSIS, ROUTINE W REFLEX MICROSCOPIC - Abnormal; Notable for the following:       Result Value   APPearance CLOUDY (*)    Bilirubin Urine SMALL (*)    All other components within normal limits   EKG  EKG Interpretation None       Radiology No results found.  Procedures Procedures (including critical care time)  Medications Ordered in ED Medications - No data to display   Initial Impression / Assessment and Plan / ED Course  I have reviewed the triage vital signs and the nursing notes.  Pertinent labs & imaging results that were available during my care of the patient were reviewed by me and considered in my medical decision making (see chart for details).  Final Clinical Impressions(s) / ED Diagnoses  {I have reviewed and evaluated the relevant laboratory values.   {I have reviewed the relevant previous healthcare records.  {I obtained HPI from historian.   ED Course:  Assessment: .Patient is a 56 y.o. female who presents to the ED with left low back/flank pain since Friday. No mechanism of injury. No N/V/D. No dysuria. No fevers. Worse with movement. Improves with rest. No neurological deficits appreciated. Patient is ambulatory. No warning symptoms of back pain including: fecal incontinence, urinary retention or overflow incontinence, night sweats, waking from sleep with back pain, unexplained fevers or weight loss, h/o cancer, IVDU, recent trauma. No concern for cauda equina, epidural abscess, or other serious cause of back pain. Likely muscle spasm/strain. Given Robaxin and Motrin. Conservative measures such as rest, ice/heat and pain medicine indicated with PCP follow-up if no  improvement with conservative management.   Disposition/Plan:  DC Home Additional Verbal discharge instructions given and discussed with patient.  Pt Instructed to f/u with PCP in the next week for evaluation and treatment of symptoms. Return precautions given Pt acknowledges and agrees with plan  Supervising Physician Virgel Manifold, MD  Final diagnoses:  Acute left-sided low back pain without sciatica  Muscle strain    New Prescriptions New Prescriptions   No medications on file     Shary Decamp, PA-C 01/27/17 [place], MD 01/27/17 1336

## 2017-01-30 DIAGNOSIS — M545 Low back pain: Secondary | ICD-10-CM | POA: Diagnosis not present

## 2017-01-30 DIAGNOSIS — R1084 Generalized abdominal pain: Secondary | ICD-10-CM | POA: Diagnosis not present

## 2017-02-08 ENCOUNTER — Ambulatory Visit (INDEPENDENT_AMBULATORY_CARE_PROVIDER_SITE_OTHER): Payer: Commercial Managed Care - HMO | Admitting: Gastroenterology

## 2017-02-08 ENCOUNTER — Encounter: Payer: Self-pay | Admitting: Gastroenterology

## 2017-02-08 VITALS — BP 122/72 | HR 96 | Ht 67.25 in | Wt 224.2 lb

## 2017-02-08 DIAGNOSIS — R1031 Right lower quadrant pain: Secondary | ICD-10-CM

## 2017-02-08 DIAGNOSIS — K5900 Constipation, unspecified: Secondary | ICD-10-CM | POA: Diagnosis not present

## 2017-02-08 DIAGNOSIS — Z800 Family history of malignant neoplasm of digestive organs: Secondary | ICD-10-CM | POA: Diagnosis not present

## 2017-02-08 NOTE — Progress Notes (Addendum)
02/08/2017 Gina [name] [id number] 21-Aug-1961   HISTORY OF PRESENT ILLNESS:  This is a 56 year old female who is known to Dr. Carlean Purl for chronic constipation.  She is here today with complaints of RLQ abdominal pain.  She says that when she saw her PCP and he examined her that it was very sore/tender in her RLQ and he was concerned so he wanted her seen here.  She says that she really did not notice any pain prior to that.  She says that up until recently her constipation was doing well and she was not even needing to take her Miralax.  Recently she has been having trouble again so she took a dose of MOM and is going to restart her daily Miralax.  Feels that the Miralax helps but sometimes the stool comes out in thin pieces.  Denies rectal bleeding.  No nausea, vomiting, fever, chills.  She has family history of colon cancer in her mother.  Patient is due for colonoscopy since her last procedure was in 11/2011.  At that time she was found to have a normal study.    Past Medical History:  Diagnosis Date  . Constipation   . Dysphagia, oral phase   . Esophageal reflux   . Family history of malignant neoplasm of gastrointestinal tract   . Hiatal hernia   . Irritable bowel syndrome   . Unspecified vitamin D deficiency    Past Surgical History:  Procedure Laterality Date  . ABDOMINAL HYSTERECTOMY    . COLONOSCOPY  multiple  . ESOPHAGEAL DILATION    . ILEOSTOMY    . RECTOVAGINAL FISTULA CLOSURE    . SHOULDER SURGERY      reports that she has been smoking Cigarettes.  She has a 30.00 pack-year smoking history. She has never used smokeless tobacco. She reports that she drinks alcohol. She reports that she does not use drugs. family history includes Colon cancer in her mother; Coronary artery disease in her mother; Diabetes in her mother; Hypertension in her mother. Allergies  Allergen Reactions  . Sulfa Antibiotics Other (See Comments)    unknown      Outpatient Encounter  Prescriptions as of 02/08/2017  Medication Sig  . cyclobenzaprine (FLEXERIL) 10 MG tablet Take 1 tablet (10 mg total) by mouth 2 (two) times daily as needed for muscle spasms.  . fluticasone (FLONASE) 50 MCG/ACT nasal spray Place 2 sprays into the nose as needed. Reported on 12/20/2015  . ibuprofen (ADVIL,MOTRIN) 600 MG tablet Take 1 tablet (600 mg total) by mouth every 6 (six) hours as needed.  . polyethylene glycol powder (MIRALAX) powder Take 17 g by mouth 2 (two) times daily.   No facility-administered encounter medications on file as of 02/08/2017.      REVIEW OF SYSTEMS  : All other systems reviewed and negative except where noted in the History of Present Illness.   PHYSICAL EXAM: BP 122/72 (BP Location: Left Arm, Patient Position: Sitting, Cuff Size: Normal)   Pulse 96   Ht 5' 7.25" (1.708 m) Comment: height measured without shoes  Wt 224 lb 4 oz (101.7 kg)   BMI 34.86 kg/m  General: Well developed black female in no acute distress Head: Normocephalic and atraumatic Eyes:  Sclerae anicteric, conjunctiva pink. Ears: Normal auditory acuity Lungs: Clear throughout to auscultation Heart: Regular rate and rhythm Abdomen: Soft, non-distended.  Normal bowel sounds.  Exaggerated response to right sided palpation even with very light palpation (then I observed that she had no  sign of discomfort when she buttoned her pants and buckled her belt that caused pressure in the same area). Rectal:  Will be done at the time of colonoscopy. Musculoskeletal: Symmetrical with no gross deformities  Skin: No lesions on visible extremities Extremities: No edema  Neurological: Alert oriented x 4, grossly non-focal Psychological:  Alert and cooperative. Normal mood and affect  ASSESSMENT AND PLAN: -Family history of colon cancer:  Mother had colon cancer.  Patient's last colonoscopy was 11/2011.  Will schedule for colonoscopy with Dr. Carlean Purl. -RLQ abdominal pain, chronic constipation:  ? If pain is  related to constipation.  Patient reported she only became aware of the pain once the area was palpated/examined by her PCP.  Will be evaluated with colonoscopy.  Otherwise she will continue her bowel regimen with MiraLAX daily. I also advised to possibly add a daily powder fiber supplement such as Citrucel or Benefiber.  ? GYN source of pain (had hysterectomy but still has one ovary--unsure which side) vs scar tissue, etc.  *The risks, benefits, and alternatives to colonoscopy were discussed with the patient and she consents to proceed.   CC:  Gina Pretty, MD  Agree with Gina [name]'s management.  Gina Mayer, MD, Gina [name]

## 2017-02-08 NOTE — Patient Instructions (Signed)
You have been scheduled for a colonoscopy. Please follow written instructions given to you at your visit today.  Please pick up your prep supplies at the pharmacy within the next 1-3 days. If you use inhalers (even only as needed), please bring them with you on the day of your procedure. Your physician has requested that you go to www.startemmi.com and enter the access code given to you at your visit today. This web site gives a general overview about your procedure. However, you should still follow specific instructions given to you by our office regarding your preparation for the procedure.   If you are age 30 or older, your body mass index should be between 23-30. Your Body mass index is 34.86 kg/m. If this is out of the aforementioned range listed, please consider follow up with your Primary Care Provider.  If you are age 22 or younger, your body mass index should be between 19-25. Your Body mass index is 34.86 kg/m. If this is out of the aformentioned range listed, please consider follow up with your Primary Care Provider.   Add daily powder fiber supplement such as Benefiber or Citrucel.  Thank you for choosing me and [facility].   Alonza Bogus, PA-C

## 2017-02-20 ENCOUNTER — Encounter: Payer: Self-pay | Admitting: Internal Medicine

## 2017-03-06 ENCOUNTER — Encounter: Payer: Self-pay | Admitting: Internal Medicine

## 2017-03-06 ENCOUNTER — Ambulatory Visit (AMBULATORY_SURGERY_CENTER): Payer: Commercial Managed Care - HMO | Admitting: Internal Medicine

## 2017-03-06 VITALS — BP 125/76 | HR 77 | Temp 97.7°F | Resp 12 | Ht 67.0 in | Wt 224.0 lb

## 2017-03-06 DIAGNOSIS — Z1212 Encounter for screening for malignant neoplasm of rectum: Secondary | ICD-10-CM | POA: Diagnosis not present

## 2017-03-06 DIAGNOSIS — K6389 Other specified diseases of intestine: Secondary | ICD-10-CM | POA: Diagnosis not present

## 2017-03-06 DIAGNOSIS — Z1211 Encounter for screening for malignant neoplasm of colon: Secondary | ICD-10-CM

## 2017-03-06 DIAGNOSIS — D123 Benign neoplasm of transverse colon: Secondary | ICD-10-CM

## 2017-03-06 DIAGNOSIS — K635 Polyp of colon: Secondary | ICD-10-CM | POA: Diagnosis not present

## 2017-03-06 DIAGNOSIS — D125 Benign neoplasm of sigmoid colon: Secondary | ICD-10-CM

## 2017-03-06 DIAGNOSIS — D124 Benign neoplasm of descending colon: Secondary | ICD-10-CM

## 2017-03-06 DIAGNOSIS — Z800 Family history of malignant neoplasm of digestive organs: Secondary | ICD-10-CM | POA: Diagnosis not present

## 2017-03-06 MED ORDER — SODIUM CHLORIDE 0.9 % IV SOLN: 500.0000 mL | INTRAVENOUS | Status: DC

## 2017-03-06 NOTE — Progress Notes (Signed)
Called to room to assist during endoscopic procedure.  Patient ID and intended procedure confirmed with present staff. Received instructions for my participation in the procedure from the performing physician.  

## 2017-03-06 NOTE — Patient Instructions (Addendum)
I removed 4 very tiny polyps today. I will let you know pathology results and when to have another routine colonoscopy by mail and/or My Chart.  Glad you are feeling better.  I appreciate the opportunity to care for you. Gatha Mayer, MD, FACG   YOU HAD AN ENDOSCOPIC PROCEDURE TODAY AT [facility]:   Refer to the procedure report that was given to you for any specific questions about what was found during the examination.  If the procedure report does not answer your questions, please call your gastroenterologist to clarify.  If you requested that your care partner not be given the details of your procedure findings, then the procedure report has been included in a sealed envelope for you to review at your convenience later.  YOU SHOULD EXPECT: Some feelings of bloating in the abdomen. Passage of more gas than usual.  Walking can help get rid of the air that was put into your GI tract during the procedure and reduce the bloating. If you had a lower endoscopy (such as a colonoscopy or flexible sigmoidoscopy) you may notice spotting of blood in your stool or on the toilet paper. If you underwent a bowel prep for your procedure, you may not have a normal bowel movement for a few days.  Please Note:  You might notice some irritation and congestion in your nose or some drainage.  This is from the oxygen used during your procedure.  There is no need for concern and it should clear up in a day or so.  SYMPTOMS TO REPORT IMMEDIATELY:   Following lower endoscopy (colonoscopy or flexible sigmoidoscopy):  Excessive amounts of blood in the stool  Significant tenderness or worsening of abdominal pains  Swelling of the abdomen that is new, acute  Fever of 100F or higher   Following upper endoscopy (EGD)  Vomiting of blood or coffee ground material  New chest pain or pain under the shoulder blades  Painful or persistently difficult swallowing  New shortness of  breath  Fever of 100F or higher  Black, tarry-looking stools  For urgent or emergent issues, a gastroenterologist can be reached at any hour by calling [phone].   DIET:  We do recommend a small meal at first, but then you may proceed to your regular diet.  Drink plenty of fluids but you should avoid alcoholic beverages for 24 hours.  ACTIVITY:  You should plan to take it easy for the rest of today and you should NOT DRIVE or use heavy machinery until tomorrow (because of the sedation medicines used during the test).    FOLLOW UP: Our staff will call the number listed on your records the next business day following your procedure to check on you and address any questions or concerns that you may have regarding the information given to you following your procedure. If we do not reach you, we will leave a message.  However, if you are feeling well and you are not experiencing any problems, there is no need to return our call.  We will assume that you have returned to your regular daily activities without incident.  If any biopsies were taken you will be contacted by phone or by letter within the next 1-3 weeks.  Please call us at [phone] if you have not heard about the biopsies in 3 weeks.    SIGNATURES/CONFIDENTIALITY: You and/or your care partner have signed paperwork which will be entered into your electronic medical record.  These  signatures attest to the fact that that the information above on your After Visit Summary has been reviewed and is understood.  Full responsibility of the confidentiality of this discharge information lies with you and/or your care-partner.  Polyp information given.

## 2017-03-06 NOTE — Op Note (Addendum)
[place] Patient Name: Pricila Bridge Procedure Date: 03/06/2017 3:32 PM MRN: [id number] Endoscopist: Gatha Mayer , MD Age: 56 Referring MD:  Date of Birth: [birth year] Gender: Female Account #: [id number] Procedure:                Colonoscopy Indications:              Screening in patient at increased risk: Colorectal                            cancer in mother 68 or older - early 67's Medicines:                Propofol per Anesthesia, Monitored Anesthesia Care Procedure:                Pre-Anesthesia Assessment:                           - Prior to the procedure, a History and Physical                            was performed, and patient medications and                            allergies were reviewed. The patient's tolerance of                            previous anesthesia was also reviewed. The risks                            and benefits of the procedure and the sedation                            options and risks were discussed with the patient.                            All questions were answered, and informed consent                            was obtained. Prior Anticoagulants: The patient has                            taken no previous anticoagulant or antiplatelet                            agents. ASA Grade Assessment: II - A patient with                            mild systemic disease. After reviewing the risks                            and benefits, the patient was deemed in                            satisfactory condition to undergo the procedure.  After obtaining informed consent, the colonoscope                            was passed under direct vision. Throughout the                            procedure, the patient's blood pressure, pulse, and                            oxygen saturations were monitored continuously. The                            Colonoscope was introduced through the anus and             advanced to the the cecum, identified by                            appendiceal orifice and ileocecal valve. The                            colonoscopy was performed without difficulty. The                            patient tolerated the procedure well. The quality                            of the bowel preparation was excellent. The bowel                            preparation used was Miralax. The ileocecal valve,                            appendiceal orifice, and rectum were photographed. Scope In: 3:52:02 PM Scope Out: 4:07:57 PM Scope Withdrawal Time: 0 hours 14 minutes 11 seconds  Total Procedure Duration: 0 hours 15 minutes 55 seconds  Findings:                 The perianal and digital rectal examinations were                            normal.                           Four sessile polyps were found in the sigmoid                            colon, descending colon and transverse colon. The                            polyps were 1 to 3 mm in size. These polyps were                            removed with a cold biopsy forceps. Resection and  retrieval were complete. Verification of patient                            identification for the specimen was done. Estimated                            blood loss was minimal.                           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                           The exam was otherwise without abnormality on                            direct and retroflexion views. Complications:            No immediate complications. Estimated Blood Loss:     Estimated blood loss was minimal. Impression:               - Four 1 to 3 mm polyps in the sigmoid colon, in                            the descending colon and in the transverse colon,                            removed with a cold biopsy forceps. Resected and                            retrieved.                            - Rectovaginal fistula repair changes.                           - The examination was otherwise normal on direct                            and retroflexion views. Recommendation:           - Patient has a contact number available for                            emergencies. The signs and symptoms of potential                            delayed complications were discussed with the                            patient. Return to normal activities tomorrow.                            Written discharge instructions were provided to  the                            patient.                           - Resume previous diet.                           - Continue present medications.                           - Repeat colonoscopy is recommended. The                            colonoscopy date will be determined after pathology                            results from today's exam become available for                            review. Gatha Mayer, MD 03/06/2017 4:15:42 PM This report has been signed electronically.

## 2017-03-06 NOTE — Progress Notes (Signed)
A and O x3. Report to RN. Tolerated MAC anesthesia well.

## 2017-03-07 ENCOUNTER — Telehealth: Payer: Self-pay

## 2017-03-07 ENCOUNTER — Telehealth: Payer: Self-pay | Admitting: Internal Medicine

## 2017-03-07 ENCOUNTER — Telehealth: Payer: Self-pay | Admitting: *Deleted

## 2017-03-07 NOTE — Telephone Encounter (Signed)
Left message for patient to call back  

## 2017-03-07 NOTE — Telephone Encounter (Signed)
   Follow up Call-  Call back number 03/06/2017  Post procedure Call Back phone  # [phone]  Permission to leave phone message Yes  Some recent data might be hidden     Left message

## 2017-03-07 NOTE — Telephone Encounter (Signed)
  Follow up Call-  Call back number 03/06/2017  Post procedure Call Back phone  # [phone]  Permission to leave phone message Yes  Some recent data might be hidden     Lm on number that idebtifies pt to return call with questions or concerns, marie Breylan Lefevers rn

## 2017-03-07 NOTE — Telephone Encounter (Signed)
Patient notified that the runny nose is most likely from the oxygen she had on during the procedure yesterday.  She is advised that it will improve in the next day or so.  She is advised that she can take an allergy pill if she would like to.

## 2017-03-14 ENCOUNTER — Encounter: Payer: Self-pay | Admitting: Internal Medicine

## 2017-03-14 NOTE — Progress Notes (Signed)
Polyps not precancerous Recall 5 yrs 2023 [facility]

## 2017-04-30 DIAGNOSIS — R200 Anesthesia of skin: Secondary | ICD-10-CM | POA: Diagnosis not present

## 2017-04-30 DIAGNOSIS — R0789 Other chest pain: Secondary | ICD-10-CM | POA: Diagnosis not present

## 2017-05-23 DIAGNOSIS — R9431 Abnormal electrocardiogram [ECG] [EKG]: Secondary | ICD-10-CM | POA: Diagnosis not present

## 2017-05-23 DIAGNOSIS — Z720 Tobacco use: Secondary | ICD-10-CM | POA: Diagnosis not present

## 2017-05-23 DIAGNOSIS — R079 Chest pain, unspecified: Secondary | ICD-10-CM | POA: Diagnosis not present

## 2017-06-05 DIAGNOSIS — R079 Chest pain, unspecified: Secondary | ICD-10-CM | POA: Diagnosis not present

## 2017-06-06 DIAGNOSIS — E559 Vitamin D deficiency, unspecified: Secondary | ICD-10-CM | POA: Diagnosis not present

## 2017-06-06 DIAGNOSIS — Z0000 Encounter for general adult medical examination without abnormal findings: Secondary | ICD-10-CM | POA: Diagnosis not present

## 2017-06-07 DIAGNOSIS — Z01419 Encounter for gynecological examination (general) (routine) without abnormal findings: Secondary | ICD-10-CM | POA: Diagnosis not present

## 2017-06-07 DIAGNOSIS — Z0000 Encounter for general adult medical examination without abnormal findings: Secondary | ICD-10-CM | POA: Diagnosis not present

## 2017-06-13 DIAGNOSIS — R079 Chest pain, unspecified: Secondary | ICD-10-CM | POA: Diagnosis not present

## 2017-07-01 ENCOUNTER — Ambulatory Visit (INDEPENDENT_AMBULATORY_CARE_PROVIDER_SITE_OTHER): Payer: 59 | Admitting: Neurology

## 2017-07-01 ENCOUNTER — Encounter: Payer: Self-pay | Admitting: Neurology

## 2017-07-01 VITALS — BP 130/72 | HR 88 | Ht 68.0 in | Wt 222.0 lb

## 2017-07-01 DIAGNOSIS — R209 Unspecified disturbances of skin sensation: Secondary | ICD-10-CM | POA: Diagnosis not present

## 2017-07-01 DIAGNOSIS — R202 Paresthesia of skin: Secondary | ICD-10-CM

## 2017-07-01 NOTE — Patient Instructions (Addendum)
Your neurological exam is good, thankfully.  Nevertheless for your left facial intermittent numbness, let's check a brain scan, called MRI and call you with the test results. We will have to schedule you for this on a separate date. This test requires authorization from your insurance, and we will take care of the insurance process. Keep working on your smoking cessation. Drink water well for good hydration.  So long as your brain MRI is reassuring, I can see you back as needed.  Please talk to Dr. Shelia Media about stress management. Please make sure you rest well.

## 2017-07-01 NOTE — Progress Notes (Signed)
Subjective:    Patient ID: Gina [name] is a 56 y.o. female.  HPI     Star Age, MD, PhD [facility] [address] [place], [place] [address]  Dear Dr. Shelia Media,  I saw your patient, Gina [name], upon your kind request in my neurologic clinic today for initial consultation of her L facial numbness. The patient is unaccompanied today. As you know, Gina [name] is a 56 year old right-handed woman with an underlying medical history of blindness R eye, COPD, smoking, allergies, osteoarthritis, and obesity, who reports left facial numbness and tingling for the past 3 month, intermittent. I reviewed your office note from 04/30/2017.  She denies one sided weakness, slurring of speech, recurrent headaches, numbness elsewhere.  When she first noticed the left facial numbness she had associated chest pain with this. Since then, she has been seen at [facility] cardiology and had cardiac workup including EKG and as I understand an exercise stress test which was negative per patient. She has intermittent left lower face numbness and tingling, feels like Novocain at times but no actual weakness, no slurring of speech, no drooling from the left side of the mouth, no grip strength weakness, no tingling in her feet or hands. Of note, she does not always drink enough water. She also reports quite a bit of stress regarding her husband's health. She does not sleep very well currently because of his issues. She smokes half a pack per day and has reduced her smoking. She drinks alcohol rarely and caffeine in the form of sodas, typically 2 per day. She works, lives with her husband, has 3 grown children.  Her Past Medical History Is Significant For: Past Medical History:  Diagnosis Date  . Constipation   . Dysphagia, oral phase   . Esophageal reflux   . Family history of malignant neoplasm of gastrointestinal tract   . Hiatal hernia   . Irritable bowel  syndrome   . Unspecified vitamin D deficiency     Her Past Surgical History Is Significant For: Past Surgical History:  Procedure Laterality Date  . ABDOMINAL HYSTERECTOMY    . COLONOSCOPY  multiple  . ESOPHAGEAL DILATION    . EYE SURGERY    . ILEOSTOMY    . ILEOSTOMY CLOSURE    . RECTOVAGINAL FISTULA CLOSURE    . SHOULDER SURGERY    . SHOULDER SURGERY    . UPPER GASTROINTESTINAL ENDOSCOPY      Her Family History Is Significant For: Family History  Problem Relation Age of Onset  . Hypertension Mother   . Diabetes Mother   . Coronary artery disease Mother   . Colon cancer Mother        75's    Her Social History Is Significant For: Social History   Social History  . Marital status: Married    Spouse name: N/A  . Number of children: 3  . Years of education: N/A   Occupational History  .  [employer]   Social History Main Topics  . Smoking status: Current Every Day Smoker    Packs/day: 0.50    Years: 30.00    Types: Cigarettes  . Smokeless tobacco: Never Used  . Alcohol use Yes     Comment: occ.  . Drug use: No  . Sexual activity: Yes    Birth control/ protection: Surgical   Other Topics Concern  . None   Social History Narrative  . None    Her Allergies Are:  Allergies  Allergen Reactions  . Sulfa Antibiotics Other (See Comments)    unknown  :   Her Current Medications Are:  Outpatient Encounter Prescriptions as of 07/01/2017  Medication Sig  . cyclobenzaprine (FLEXERIL) 10 MG tablet Take 1 tablet (10 mg total) by mouth 2 (two) times daily as needed for muscle spasms.  . fluticasone (FLONASE) 50 MCG/ACT nasal spray Place 2 sprays into the nose as needed. Reported on 12/20/2015  . ibuprofen (ADVIL,MOTRIN) 600 MG tablet Take 1 tablet (600 mg total) by mouth every 6 (six) hours as needed.  . polyethylene glycol powder (MIRALAX) powder Take 17 g by mouth 2 (two) times daily.  Marland Kitchen PROAIR HFA 108 (90 Base) MCG/ACT inhaler    Facility-Administered  Encounter Medications as of 07/01/2017  Medication  . 0.9 %  sodium chloride infusion  :   Review of Systems:  Out of a complete 14 point review of systems, all are reviewed and negative with the exception of these symptoms as listed below:  Review of Systems  Neurological:       Pt presents today to discuss her intermittent facial numbness. Pt's facial numbness started about 3 months ago with chest pain. Pt's facial numbness is mostly on the left side but seems to come and go.    Objective:  Neurological Exam  Physical Exam Physical Examination:   Vitals:   07/01/17 1437  BP: 130/72  Pulse: 88    General Examination: The patient is a very pleasant 56 y.o. female in no acute distress. She appears well-developed and well-nourished and well groomed.   HEENT: Normocephalic, atraumatic, Left pupil is reactive to light, right pupil is not reactive to light, she is blind in the right eye, she has mild cataract on the left. Funduscopic exam is benign on the left, not doable on the right. Extraocular tracking is good without limitation to gaze excursion or nystagmus noted. Normal smooth pursuit is noted. Hearing is grossly intact. Face is symmetric with normal facial animation and normal facial sensation. Speech is clear with no dysarthria noted. There is no hypophonia. There is no lip, neck/head, jaw or voice tremor. Neck is supple with full range of passive and active motion. There are no carotid bruits on auscultation. Oropharynx exam reveals: mild mouth dryness, adequate dental hygiene. Tongue protrudes centrally and palate elevates symmetrically.    Chest: Clear to auscultation without wheezing, rhonchi or crackles noted.  Heart: S1+S2+0, regular and normal without murmurs, rubs or gallops noted.   Abdomen: Soft, non-tender and non-distended with normal bowel sounds appreciated on auscultation.  Extremities: There is no pitting edema in the distal lower extremities bilaterally. Pedal  pulses are intact.  Skin: Warm and dry without trophic changes noted.  Musculoskeletal: exam reveals no obvious joint deformities, tenderness or joint swelling or erythema.   Neurologically:  Mental status: The patient is awake, alert and oriented in all 4 spheres. Her immediate and remote memory, attention, language skills and fund of knowledge are appropriate. There is no evidence of aphasia, agnosia, apraxia or anomia. Speech is clear with normal prosody and enunciation. Thought process is linear. Mood is normal and affect is normal.  Cranial nerves II - XII are as described above under HEENT exam. In addition: shoulder shrug is normal with equal shoulder height noted. Motor exam: Normal bulk, strength and tone is noted. There is no drift, tremor or rebound. Romberg is negative. Reflexes are 2+ throughout. Babinski: Toes are flexor bilaterally. Fine motor skills and coordination: intact with  normal finger taps, normal hand movements, normal rapid alternating patting, normal foot taps and normal foot agility.  Cerebellar testing: No dysmetria or intention tremor. There is no truncal or gait ataxia.  Sensory exam: intact to light touch, pinprick, vibration, temperature sense in the upper and lower extremities.  Gait, station and balance: She stands easily. No veering to one side is noted. No leaning to one side is noted. Posture is age-appropriate and stance is narrow based. Gait shows normal stride length and normal pace. No problems turning are noted. Tandem walk is unremarkable.   Assessment and Plan:  In summary, Gina [name] is a very pleasant 56 y.o.-year old female with an underlying medical history of blindness R eye, COPD, smoking, allergies, osteoarthritis, and obesity, who presents for neurological consultation of her intermittent left lower facial paresthesias including numbness and tingling. She does have a normal neurological exam which is reassuring. She had cardiac workup in  the interim which per her report was benign.  I suggested we proceed with a brain MRI with and without contrast to rule out a structural cause. So long as this is benign, I can see her back on an as-needed basis. We talked about the importance of sleep and stress reduction. She's not sleeping very well currently and reports significant stress what with her husband's health problems. She is encouraged to talk to you about stress management.  She is advised to stay better hydrated with water and also quit smoking. We will call her with her brain MRI results. I answered all her questions today and she was in agreement. Thank you very much for allowing me to participate in the care of this nice patient. If I can be of any further assistance to you please do not hesitate to call me at [phone].  Sincerely,   Star Age, MD, PhD

## 2017-07-08 ENCOUNTER — Telehealth: Payer: Self-pay | Admitting: Internal Medicine

## 2017-07-08 NOTE — Telephone Encounter (Signed)
Left message for pt to call back  °

## 2017-07-08 NOTE — Telephone Encounter (Signed)
Patient with lower abdominal pain and change in bowel habits.  She does not want to wait until 08/02/17 appt that is offered with Dr. Carlean Purl.  She will come in and see Ellouise Newer, [place] on 07/12/17

## 2017-07-10 ENCOUNTER — Encounter: Payer: Self-pay | Admitting: Neurology

## 2017-07-10 ENCOUNTER — Ambulatory Visit (INDEPENDENT_AMBULATORY_CARE_PROVIDER_SITE_OTHER): Payer: 59

## 2017-07-10 DIAGNOSIS — R202 Paresthesia of skin: Secondary | ICD-10-CM

## 2017-07-10 DIAGNOSIS — R209 Unspecified disturbances of skin sensation: Secondary | ICD-10-CM | POA: Diagnosis not present

## 2017-07-10 MED ORDER — GADOPENTETATE DIMEGLUMINE 469.01 MG/ML IV SOLN: 20.0000 mL | Freq: Once | INTRAVENOUS | Status: AC | PRN

## 2017-07-11 NOTE — Progress Notes (Signed)
Please call and advise the patient that the recent scan we did was within normal limits. We did a brain MRI with and wo contrast, which showed normal findings. In particular, there were no acute findings, such as a stroke, or mass or blood products. No further action is required on this test at this time. As discussed, she can FU with PCP.  Thanks,  Star Age, MD, PhD

## 2017-07-12 ENCOUNTER — Ambulatory Visit (INDEPENDENT_AMBULATORY_CARE_PROVIDER_SITE_OTHER)
Admission: RE | Admit: 2017-07-12 | Discharge: 2017-07-12 | Disposition: A | Discharge status: Home / Self Care | Payer: 59 | Source: Ambulatory Visit | Attending: Acute Care | Admitting: Acute Care

## 2017-07-12 ENCOUNTER — Encounter: Payer: Self-pay | Admitting: Physician Assistant

## 2017-07-12 ENCOUNTER — Telehealth: Payer: Self-pay

## 2017-07-12 ENCOUNTER — Ambulatory Visit (INDEPENDENT_AMBULATORY_CARE_PROVIDER_SITE_OTHER): Payer: 59 | Admitting: Physician Assistant

## 2017-07-12 VITALS — BP 110/74 | HR 79 | Ht 68.0 in | Wt 217.0 lb

## 2017-07-12 DIAGNOSIS — R1084 Generalized abdominal pain: Secondary | ICD-10-CM

## 2017-07-12 DIAGNOSIS — K5909 Other constipation: Secondary | ICD-10-CM | POA: Diagnosis not present

## 2017-07-12 DIAGNOSIS — R141 Gas pain: Secondary | ICD-10-CM | POA: Diagnosis not present

## 2017-07-12 DIAGNOSIS — F17210 Nicotine dependence, cigarettes, uncomplicated: Secondary | ICD-10-CM

## 2017-07-12 DIAGNOSIS — Z87891 Personal history of nicotine dependence: Secondary | ICD-10-CM | POA: Diagnosis not present

## 2017-07-12 NOTE — Telephone Encounter (Signed)
-----   Message from Star Age, MD sent at 07/11/2017  1:08 PM EDT ----- Please call and advise the patient that the recent scan we did was within normal limits. We did a brain MRI with and wo contrast, which showed normal findings. In particular, there were no acute findings, such as a stroke, or mass or blood products. No further action is required on this test at this time. As discussed, she can FU with PCP.  Thanks,  Star Age, MD, PhD

## 2017-07-12 NOTE — Telephone Encounter (Signed)
I called pt to discuss her MRI results. No answer, left a message asking her to call me back. 

## 2017-07-12 NOTE — Patient Instructions (Signed)
Take Miralax once a day. Increase up to four times a day if needed as discussed.

## 2017-07-12 NOTE — Telephone Encounter (Signed)
Pt returned my call. I advised her that Dr. Rexene Alberts reviewed her MRI and found that it was within normal limits. Pt should follow up with PCP as discussed. Pt verbalized understanding of results. Pt had no questions at this time but was encouraged to call back if questions arise.

## 2017-07-12 NOTE — Progress Notes (Addendum)
Chief Complaint: Abdominal pain, bloating  HPI:   Gina [name] is a 56 year old African-American female with a past medical history of rectovaginal fistula repair and chronic constipation, who presents to clinic today with a complaint of abdominal pain and bloating.    Patient regularly follows with Dr. Carlean Purl and was last seen 03/06/17 for colonoscopy with a finding of four 1-3 mm polyps in the sigmoid colon, descending colon and transverse colon as well as rectovaginal fistula repair changes and otherwise normal exam. Pathology revealed noncancerous polyps and recall was placed for 5 years.   Today, the patient describes that on September 7 she experienced a lot of " bloating and gas", this was quite severe and on the 9th of September, she developed a lower abdominal pain which wrapped around her abdomen and into her back. Patient does describe a history of problems with regular bowel movements. She tells me they are "never normal". Recently the patient has not been using her MiraLAX because she has been producing some stool, butshe never feels empty. Patient describes having "smaller than normal, slimy" stool over the past 2 weeks. Patient notes that she has continued with an excessive gas in her system and feels bloated at times. She also experienced some epigastric discomfort yesterday but felt "gas move through my system to my rectum" and when she passed gas this pain felt better.   Patient denies fever, chills, blood in her stool, melena, weight loss, anorexia, nausea, vomiting, heartburn, reflux, dysphagia or symptoms that awaken her at night.  Past Medical History:  Diagnosis Date  . Constipation   . Dysphagia, oral phase   . Esophageal reflux   . Family history of malignant neoplasm of gastrointestinal tract   . Hiatal hernia   . Irritable bowel syndrome   . Unspecified vitamin D deficiency     Past Surgical History:  Procedure Laterality Date  . ABDOMINAL HYSTERECTOMY    .  COLONOSCOPY  multiple  . ESOPHAGEAL DILATION    . EYE SURGERY    . ILEOSTOMY    . ILEOSTOMY CLOSURE    . RECTOVAGINAL FISTULA CLOSURE    . SHOULDER SURGERY    . SHOULDER SURGERY    . UPPER GASTROINTESTINAL ENDOSCOPY      Current Outpatient Prescriptions  Medication Sig Dispense Refill  . cyclobenzaprine (FLEXERIL) 10 MG tablet Take 1 tablet (10 mg total) by mouth 2 (two) times daily as needed for muscle spasms. 20 tablet 0  . fluticasone (FLONASE) 50 MCG/ACT nasal spray Place 2 sprays into the nose as needed. Reported on 12/20/2015    . ibuprofen (ADVIL,MOTRIN) 600 MG tablet Take 1 tablet (600 mg total) by mouth every 6 (six) hours as needed. 30 tablet 0  . polyethylene glycol powder (MIRALAX) powder Take 17 g by mouth 2 (two) times daily. 500 g 11  . PROAIR HFA 108 (90 Base) MCG/ACT inhaler      Current Facility-Administered Medications  Medication Dose Route Frequency Provider Last Rate Last Dose  . 0.9 %  sodium chloride infusion  500 mL Intravenous Continuous Gatha Mayer, MD       Facility-Administered Medications Ordered in Other Visits  Medication Dose Route Frequency Provider Last Rate Last Dose  . gadopentetate dimeglumine (MAGNEVIST) injection 20 mL  20 mL Intravenous Once PRN Star Age, MD        Allergies as of 07/12/2017 - Review Complete 07/12/2017  Allergen Reaction Noted  . Sulfa antibiotics Other (See Comments) 01/27/2017    Family  History  Problem Relation Age of Onset  . Hypertension Mother   . Diabetes Mother   . Coronary artery disease Mother   . Colon cancer Mother        68's    Social History   Social History  . Marital status: Married    Spouse name: N/A  . Number of children: 3  . Years of education: N/A   Occupational History  .  [employer]   Social History Main Topics  . Smoking status: Current Every Day Smoker    Packs/day: 0.50    Years: 30.00    Types: Cigarettes  . Smokeless tobacco: Never Used  . Alcohol use Yes       Comment: occ.  . Drug use: No  . Sexual activity: Yes    Birth control/ protection: Surgical   Other Topics Concern  . Not on file   Social History Narrative  . No narrative on file    Review of Systems:    Constitutional: No weight loss, fever or chills Cardiovascular: No chest pain   Respiratory: No SOB  Gastrointestinal: See HPI and otherwise negative   Physical Exam:  Vital signs: BP 110/74   Pulse 79   Ht 5\' 8"  (1.727 m)   Wt 217 lb (98.4 kg)   BMI 32.99 kg/m   Constitutional:   Pleasant AA female appears to be in NAD, Well developed, Well nourished, alert and cooperative Respiratory: Respirations even and unlabored. Lungs clear to auscultation bilaterally.   No wheezes, crackles, or rhonchi.  Cardiovascular: Normal S1, S2. No MRG. Regular rate and rhythm. No peripheral edema, cyanosis or pallor.  Gastrointestinal:  Soft, nondistended,mild generalized tpp, worse in LLQ No rebound or guarding. Normal bowel sounds. No appreciable masses or hepatomegaly. Psychiatric:  Demonstrates good judgement and reason without abnormal affect or behaviors.  No recent labs or imaging.  Assessment: 1. Abdominal pain: Likely with constipation 2. Constipation: Chronic for the patient, not using daily MiraLAX at this time but continues to describe feeling not completely empty and smaller than normal stools 3. Bloating  Plan: 1. Discussed with the patient today that I believe that she is slightly constipated and has an excess of gas in her system and some associated abdominal pain. 2. Would recommend patient use her MiraLAX on a once daily basis. She can use up to 4 times a day if necessary to have a regular soft formed stool. 3. If patient's symptoms of bloating and abdominal pain do not improve after above and a week of regular stools she can call our clinic. Patient is somewhat concerned that there may be something else going on and I discussed that we could order a CT abdomen  pelvis at that time if symptoms continue. 4. Patient to follow in clinic per recommendations after above if necessary with Dr. Carlean Purl or myself  Gina Newer, PA-C [facility] 07/12/2017, 1:39 PM  Cc: Deland Pretty, MD   Agree with Ms. Lemmon's evaluation and management.  Gatha Mayer, MD, [facility] Reg[facility]

## 2017-07-18 ENCOUNTER — Other Ambulatory Visit: Payer: Self-pay | Admitting: Acute Care

## 2017-07-18 DIAGNOSIS — Z122 Encounter for screening for malignant neoplasm of respiratory organs: Secondary | ICD-10-CM

## 2017-07-18 DIAGNOSIS — F17210 Nicotine dependence, cigarettes, uncomplicated: Secondary | ICD-10-CM

## 2017-09-10 ENCOUNTER — Encounter: Payer: Self-pay | Admitting: Internal Medicine

## 2017-09-10 ENCOUNTER — Ambulatory Visit: Payer: 59 | Admitting: Internal Medicine

## 2017-09-10 VITALS — BP 98/68 | HR 94 | Ht 68.0 in | Wt 220.0 lb

## 2017-09-10 DIAGNOSIS — K6289 Other specified diseases of anus and rectum: Secondary | ICD-10-CM

## 2017-09-10 DIAGNOSIS — R143 Flatulence: Secondary | ICD-10-CM | POA: Diagnosis not present

## 2017-09-10 DIAGNOSIS — K5909 Other constipation: Secondary | ICD-10-CM | POA: Diagnosis not present

## 2017-09-10 DIAGNOSIS — R151 Fecal smearing: Secondary | ICD-10-CM

## 2017-09-10 MED ORDER — AMBULATORY NON FORMULARY MEDICATION: 2 refills | Status: DC

## 2017-09-10 NOTE — Patient Instructions (Addendum)
We have sent the following medications to [facility] for you to pick up at your convenience:  Diltiazem/lidocaine  You will receive in the mail the information you need to complete the breath test.    You have been scheduled to have an anorectal manometry at [facility] on 09/16/17 at 10:30AM. Please arrive 30 minutes prior to your appointment time for registration (1st floor of the hospital-admissions).  Please make certain to use 1 Fleets enema 2 hours prior to coming for your appointment. You can purchase Fleets enemas from the laxative section at your drug store. You should not eat anything during the two hours prior to the procedure. You may take regular medications with small sips of water at least 2 hours prior to the study.  Anorectal manometry is a test performed to evaluate patients with constipation or fecal incontinence. This test measures the pressures of the anal sphincter muscles, the sensation in the rectum, and the neural reflexes that are needed for normal bowel movements.  THE PROCEDURE The test takes approximately 30 minutes to 1 hour. You will be asked to change into a hospital gown. A technician or nurse will explain the procedure to you, take a brief health history, and answer any questions you may have. The patient then lies on his or her left side. A small, flexible tube, about the size of a thermometer, with a balloon at the end is inserted into the rectum. The catheter is connected to a machine that measures the pressure. During the test, the small balloon attached to the catheter may be inflated in the rectum to assess the normal reflex pathways. The nurse or technician may also ask the person to squeeze, relax, and push at various times. The anal sphincter muscle pressures are measured during each of these maneuvers. To squeeze, the patient tightens the sphincter muscles as if trying to prevent anything from coming out. To push or bear down, the patient  strains down as if trying to have a bowel movement.  Take 1-2 tablespoons of Metamucil daily; use the Miralax as needed  We will see you back in January on the 17th at 10:00AM   I appreciate the opportunity to care for you. Silvano Rusk, MD, [facility]

## 2017-09-10 NOTE — Progress Notes (Signed)
Gina [name] y.o. [birth year] [id number]  Assessment & Plan:   Encounter Diagnoses  Name Primary?  . Chronic constipation Yes  . Excessive gas   . Fecal smearing   . Rectal pain - ? fissure     She has better with MiraLAX and fiber, perhaps MiraLAX is too strong.  We will try fiber supplementation alone with Benefiber.  For the gas symptoms check lactulose hydrogen breath test.  She is tender in her rectum I am not sure she has a fissure the exam suggest that but the history does not.  Anorectal manometry will be ordered and which she will be treated with diltiazem lidocaine topically.  Further plan pending test results.  I appreciate the opportunity to care for this patient. CC: Deland Pretty, MD  Subjective:   Chief Complaint: Constipation gas  HPI  The patient presents for follow-up after seeing Ellouise Newer PA-C last month, at which point she was complaining of significant constipation and bloating and gaseousness.  She was instructed to use MiraLAX which she has done and she has been alternating MiraLAX with a fiber supplement and moves her bowels much better and has less gas but still a problem.  Gas-X has helped.  She still gets some cramps.  She has incomplete defecation sensation and says she has a white multiple times.  She does recall using a probiotic cannot remember the type and response in the past.  She will get a sense she has to defecate and then it will be mostly gas and a small amount of stool. 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  Her middle child delivery did require some suturing after the delivery and episiotomy, but she recalls having the placenta delivered after she was stitched up and that was a terrible experience.   Allergies  Allergen Reactions  . Sulfa Antibiotics Other (See Comments)    unknown   Current Meds    Medication Sig  . cyclobenzaprine (FLEXERIL) 10 MG tablet Take 1 tablet (10 mg total) by mouth 2 (two) times daily as needed for muscle spasms.  . fluticasone (FLONASE) 50 MCG/ACT nasal spray Place 2 sprays into the nose as needed. Reported on 12/20/2015  . ibuprofen (ADVIL,MOTRIN) 600 MG tablet Take 1 tablet (600 mg total) by mouth every 6 (six) hours as needed.  . polyethylene glycol powder (MIRALAX) powder Take 17 g by mouth 2 (two) times daily.  Marland Kitchen PROAIR HFA 108 (90 Base) MCG/ACT inhaler    Past Medical History:  Diagnosis Date  . Constipation   . Dysphagia, oral phase   . Esophageal reflux   . Family history of malignant neoplasm of gastrointestinal tract   . Hiatal hernia   . Irritable bowel syndrome   . Unspecified vitamin D deficiency    Past Surgical History:  Procedure Laterality Date  . ABDOMINAL HYSTERECTOMY    . COLONOSCOPY  multiple  . ESOPHAGEAL DILATION    . EYE SURGERY    . ILEOSTOMY    . ILEOSTOMY CLOSURE    . RECTOVAGINAL FISTULA CLOSURE    . SHOULDER SURGERY    . SHOULDER SURGERY    . UPPER GASTROINTESTINAL ENDOSCOPY     Social History   Social History Narrative   Married works at [employer]    3 children grown   Social alcohol use no substances or tobacco   09/11/2017  family history includes Colon cancer in her mother; Coronary artery disease in her mother; Diabetes in her mother; Hypertension in her mother.   Review of Systems As per HPI  Objective:   Physical Exam BP 98/68   Pulse 94   Ht 5\' 8"  (1.727 m)   Wt 220 lb (99.8 kg)   BMI 33.45 kg/m  Female staff present for exam Overweight to obese black woman in no acute distress   Rectal exam reveals small anal tagsand she is tender and slightly indurated posteriorly in the canal.  There is no mass or significant rectocele.     there appears to be appropriate voluntary and involuntary tone, and appropriate relaxation and descent with simulated defecation  Anoscopy - ? Small  fissure - tender posteriorly.

## 2017-09-11 ENCOUNTER — Encounter: Payer: Self-pay | Admitting: Internal Medicine

## 2017-09-16 ENCOUNTER — Encounter (HOSPITAL_COMMUNITY)
Admission: RE | Disposition: A | Discharge status: Home / Self Care | Payer: Self-pay | Source: Ambulatory Visit | Attending: Gastroenterology

## 2017-09-16 ENCOUNTER — Ambulatory Visit (HOSPITAL_COMMUNITY)
Admission: RE | Admit: 2017-09-16 | Discharge: 2017-09-16 | Disposition: A | Discharge status: Home / Self Care | Payer: 59 | Source: Ambulatory Visit | Attending: Gastroenterology | Admitting: Gastroenterology

## 2017-09-16 DIAGNOSIS — K5900 Constipation, unspecified: Secondary | ICD-10-CM | POA: Diagnosis not present

## 2017-09-16 DIAGNOSIS — K5902 Outlet dysfunction constipation: Secondary | ICD-10-CM | POA: Diagnosis not present

## 2017-09-16 HISTORY — PX: ANAL RECTAL MANOMETRY: SHX6358

## 2017-09-16 SURGERY — MANOMETRY, ANORECTAL

## 2017-09-16 NOTE — Progress Notes (Signed)
Anal Manometry done per protocol. Pt tolerated well with minor discomfort around anal opening then without pain once probe inserted . No complications or issues noted. Balloon expulsion test done per protocol. Pt was unable to expel balloon on her own within 3 min. Water taken out of balloon to remove from rectum. Report to be sent to Dr. Silverio Decamp.

## 2017-09-18 ENCOUNTER — Encounter (HOSPITAL_COMMUNITY): Payer: Self-pay | Admitting: Gastroenterology

## 2017-09-19 ENCOUNTER — Other Ambulatory Visit: Payer: Self-pay

## 2017-09-19 ENCOUNTER — Encounter: Payer: Self-pay | Admitting: Internal Medicine

## 2017-09-19 DIAGNOSIS — M6289 Other specified disorders of muscle: Secondary | ICD-10-CM

## 2017-09-19 DIAGNOSIS — K5900 Constipation, unspecified: Secondary | ICD-10-CM

## 2017-09-19 NOTE — Progress Notes (Signed)
Please let her know that the anorectal function is not normal  Pelvic PT can help this and I recommend she be referred for that to help her  Dx: Constipation and abnormal defecation

## 2017-09-26 ENCOUNTER — Ambulatory Visit: Payer: 59 | Attending: Internal Medicine | Admitting: Physical Therapy

## 2017-09-26 ENCOUNTER — Encounter: Payer: Self-pay | Admitting: Physical Therapy

## 2017-09-26 DIAGNOSIS — R252 Cramp and spasm: Secondary | ICD-10-CM | POA: Diagnosis not present

## 2017-09-26 DIAGNOSIS — R278 Other lack of coordination: Secondary | ICD-10-CM | POA: Diagnosis not present

## 2017-09-26 NOTE — Therapy (Signed)
[facility] [address], [place], [place], [address] Phone: [phone]   Fax:  [phone]  Physical Therapy Evaluation  Patient Details  Name: Gina [name] MRN: [id number] Date of Birth: 27-Jan-1961 Referring Provider: Gatha Mayer, MD    Encounter Date: 09/26/2017  PT End of Session - 09/26/17 0937    Visit Number  1    Date for PT Re-Evaluation  11/21/17    PT Start Time  0932    PT Stop Time  1017    PT Time Calculation (min)  45 min    Activity Tolerance  Patient tolerated treatment well    Behavior During Therapy  [facility] for tasks assessed/performed       Past Medical History:  Diagnosis Date  . Chronic constipation w/ dyssynergic defecation by anorectal manometry 12/13/2011  . Constipation   . Dysphagia, oral phase   . Esophageal reflux   . Family history of malignant neoplasm of gastrointestinal tract   . Hiatal hernia   . Irritable bowel syndrome   . Unspecified vitamin D deficiency     Past Surgical History:  Procedure Laterality Date  . ABDOMINAL HYSTERECTOMY    . ANAL RECTAL MANOMETRY N/A 09/16/2017   Procedure: ANO RECTAL MANOMETRY;  Surgeon: Mauri Pole, MD;  Location: [facility];  Service: Endoscopy;  Laterality: N/A;  . COLONOSCOPY  multiple  . ESOPHAGEAL DILATION    . EYE SURGERY    . ILEOSTOMY    . ILEOSTOMY CLOSURE    . RECTOVAGINAL FISTULA CLOSURE    . SHOULDER SURGERY    . SHOULDER SURGERY    . UPPER GASTROINTESTINAL ENDOSCOPY      There were no vitals filed for this visit.   Subjective Assessment - 09/26/17 0938    Subjective  Pt states she has constipation all her life, has only gone about 1x/week.  Has had a BM every day since taking fiber.  They were just little "slither" with mirilax.  Now taking metamucil it is more formed.  States sometimes she is straining to push air.  Leg hurts occasionally on left down to the thigh when constipated    Pertinent  History  hysterectomy    Limitations  Other (comment)    Patient Stated Goals  Be able to go 1x/day    Currently in Pain?  No/denies         [facility] PT Assessment - 09/26/17 0001      Assessment   Medical Diagnosis  K59.00 (ICD-10-CM) - Constipation, unspecified constipation type; M62.89 (ICD-10-CM) - Pelvic floor dysfunction    Referring Provider  Gatha Mayer, MD     Onset Date/Surgical Date  -- chronic    Prior Therapy  No      Precautions   Precautions  None      Restrictions   Weight Bearing Restrictions  No      Balance Screen   Has the patient fallen in the past 6 months  No      [place] residence    Living Arrangements  Spouse/significant other      Prior Function   Level of Independence  Independent    Vocation  Full time employment    Vocation Requirements  walking, sitting a little, cigarette company    Leisure  social/ church      Cognition   Overall Cognitive Status  Within Functional Limits for tasks assessed  Posture/Postural Control   Posture/Postural Control  Postural limitations    Postural Limitations  Rounded Shoulders;Increased thoracic kyphosis      ROM / Strength   AROM / PROM / Strength  PROM;Strength      PROM   Overall PROM Comments  left hip flexion 4/5; right hip 5/5      Flexibility   Soft Tissue Assessment /Muscle Length  yes    Hamstrings  25% limited    Levator Ani  50% limited      Ambulation/Gait   Gait Pattern  -- slight flexed posture             Objective measurements completed on examination: See above findings.    Pelvic Floor Special Questions - 09/26/17 0001    Prior Pelvic/Prostate Exam  Yes    Are you Pregnant or attempting pregnancy?  No    Prior Pregnancies  Yes    Number of Pregnancies  4    Number of Vaginal Deliveries  4    Any difficulty with labor and deliveries  Yes fast delivery and placenta was pulled out by physician    Episiotomy Performed  --  tearing and stitches    Currently Sexually Active  Yes    Is this Painful  Yes position related    Marinoff Scale  discomfort that does not affect completion    Urinary Leakage  Yes if I wait too long    Urinary urgency  Yes sometimes    Fecal incontinence  No constipated    Fluid intake  1 glass, 2 soda    Caffeine beverages  1 cup decaf coffee    Falling out feeling (prolapse)  No    External Perineal Exam  in side lying    Skin Integrity  Hemorroids;Erthema    Pelvic Floor Internal Exam  pt informed and consent given to perform internal assessment    Exam Type  Rectal    Sensation  hypersensative    Palpation  tender and tight puborectalis, levator ani and anal sphincters    Strength  strong squeeze, against strong resistance    Strength # of reps  1    Strength # of seconds  10    Tone  high               PT Education - 09/26/17 1017    Education provided  Yes    Education Details  toilet techniques    Person(s) Educated  Patient    Methods  Explanation;Handout    Comprehension  Verbalized understanding       PT Short Term Goals - 09/26/17 1051      PT SHORT TERM GOAL #1   Title  pt will be ind with abdominal massage to scar tissue    Time  4    Period  Weeks    Status  New    Target Date  10/24/17      PT SHORT TERM GOAL #2   Title  pt will be able to have bowel movement with 25% more ease due to being able to correctly implement toileting techniques    Time  4    Period  Weeks    Status  New    Target Date  10/24/17      PT SHORT TERM GOAL #3   Title  pt will have 20% less pain on average during her bowel movements due to increased soft tissue length    Time  4  Period  Weeks    Status  New    Target Date  10/24/17        PT Long Term Goals - 09/26/17 1511      PT LONG TERM GOAL #1   Title  pt will be ind with advanced HEP to manage symptoms and muscle spasms at home    Time  8    Period  Weeks    Status  New    Target Date  11/21/17       PT LONG TERM GOAL #2   Title  Pt will be able to have at least one BM per day due to decreased abdominal fascial adhesions and improved ability to relax pelvic floor    Time  8    Period  Weeks    Status  New    Target Date  11/21/17      PT LONG TERM GOAL #3   Title  pt will be able to have bowel movement without bearing down    Time  8    Period  Weeks    Status  New    Target Date  11/21/17      PT LONG TERM GOAL #4   Title  Pt will have bowel movements that are at least circumference of a quarter due to increased ability to relax anal sphincters.    Time  8    Period  Weeks    Status  New    Target Date  11/21/17             Plan - 09/26/17 1459    Clinical Impression Statement  Pt presents to clinic due to a chronic constipation and dyssynergia of pelvic floor.  Patient has strong contraction and lift against resistance with MMT rectally.  Pt is able to sustain contraction for at least 10 sec.  She was only able to expell one finger partially and had difficulty coordinating muscles to relax and contract at appropriate times to have BM.  Patient has high tone in puborectalis, levator ani, and rectal sphincter muscles.  Pt also has palpable tenderness of these tissues and PT unable to palpate to coccyx due to increased pain and guarding.  Pt has increased fasial adhesions along abdominal scar tissue and scar tissue from fistual repair.  Pt will benefit from skilled PT to work on improved soft tissue length and coordination to be able to have bowel movements without increased risk of injury due to excessive bearing down.    History and Personal Factors relevant to plan of care:  hysterectomy, ileostomy open/closed, rectovaginal fistula closure, IBS    Clinical Presentation  Stable    Clinical Presentation due to:  pt is stable at present    Clinical Decision Making  Moderate    Rehab Potential  Good    Clinical Impairments Affecting Rehab Potential  hysterectomy, ileostomy  open/closed, rectovaginal fistula closure, IBS    PT Frequency  1x / week    PT Duration  8 weeks    PT Treatment/Interventions  ADLs/Self Care Home Management;Biofeedback;Cryotherapy;Electrical Stimulation;Moist Heat;Therapeutic exercise;Therapeutic activities;Functional mobility training;Balance training;Patient/family education;Scar mobilization;Dry needling;Taping    PT Next Visit Plan  abdominal massage, scar tissue massage, stretches, biofeedback    PT Home Exercise Plan  progress as needed for at home self care    Consulted and Agree with Plan of Care  Patient       Patient will benefit from skilled therapeutic intervention in order to improve the following  deficits and impairments:  Pain, Increased muscle spasms, Impaired tone, Decreased coordination  Visit Diagnosis: Other lack of coordination - Plan: PT plan of care cert/re-cert  Cramp and spasm - Plan: PT plan of care cert/re-cert     Problem List Patient Active Problem List   Diagnosis Date Noted  . Family history of colon cancer 02/08/2017  . Abdominal pain, right lower quadrant 12/17/2011  . Personal history of colonic polyps 12/17/2011  . Family history of colon cancer mother - 75's 12/13/2011  . RLQ abdominal pain 12/13/2011  . Chronic constipation w/ dyssynergic defecation by anorectal manometry 12/13/2011  . H/O resection of large bowel 12/13/2011  . Cervical spondylosis 12/13/2011  . CALLUS, FOOT 03/25/2009  . FOOT PAIN, BILATERAL 03/25/2009  . FIBROIDS, UTERUS 08/25/2008  . TOBACCO ABUSE 08/25/2008  . GERD 08/25/2008  . HIATAL HERNIA 08/25/2008  . IBS 08/25/2008    Zannie Cove, PT 09/26/2017, 3:30 PM  [facility] [address], [place], [place], [address] Phone: [phone]   Fax:  [phone]  Name: Gina [name] MRN: [id number] Date of Birth: 05/26/1961

## 2017-09-26 NOTE — Patient Instructions (Signed)
Toileting Techniques for Bowel Movements (Defecation) Using your belly (abdomen) and pelvic floor muscles to have a bowel movement is usually instinctive.  Sometimes people can have problems with these muscles and have to relearn proper defecation (emptying) techniques.  If you have weakness in your muscles, organs that are falling out, decreased sensation in your pelvis, or ignore your urge to go, you may find yourself straining to have a bowel movement.  You are straining if you are: . holding your breath or taking in a huge gulp of air and holding it  . keeping your lips and jaw tensed and closed tightly . turning red in the face because of excessive pushing or forcing . developing or worsening your  hemorrhoids . getting faint while pushing . not emptying completely and have to defecate many times a day  If you are straining, you are actually making it harder for yourself to have a bowel movement.  Many people find they are pulling up with the pelvic floor muscles and closing off instead of opening the anus. Due to lack pelvic floor relaxation and coordination the abdominal muscles, one has to work harder to push the feces out.  Many people have never been taught how to defecate efficiently and effectively.  Notice what happens to your body when you are having a bowel movement.  While you are sitting on the toilet pay attention to the following areas: . Jaw and mouth position . Angle of your hips   . Whether your feet touch the ground or not . Arm placement  . Spine position . Waist . Belly tension . Anus (opening of the anal canal)  An Evacuation/Defecation Plan   Here are the 4 basic points:  1. Lean forward enough for your elbows to rest on your knees 2. Support your feet on the floor or use a low stool if your feet don't touch the floor  3. Push out your belly as if you have swallowed a beach ball-you should feel a widening of your waist 4. Open and relax your pelvic floor muscles,  rather than tightening around the anus      The following conditions my require modifications to your toileting posture:  . If you have had surgery in the past that limits your back, hip, pelvic, knee or ankle flexibility . Constipation   Your healthcare practitioner may make the following additional suggestions and adjustments:  1) Sit on the toilet  a) Make sure your feet are supported. b) Notice your hip angle and spine position-most people find it effective to lean forward or raise their knees, which can help the muscles around the anus to relax  c) When you lean forward, place your forearms on your thighs for support  2) Relax suggestions a) Breath deeply in through your nose and out slowly through your mouth as if you are smelling the flowers and blowing out the candles. b) To become aware of how to relax your muscles, contracting and releasing muscles can be helpful.  Pull your pelvic floor muscles in tightly by using the image of holding back gas, or closing around the anus (visualize making a circle smaller) and lifting the anus up and in.  Then release the muscles and your anus should drop down and feel open. Repeat 5 times ending with the feeling of relaxation. c) Keep your pelvic floor muscles relaxed; let your belly bulge out. d) The digestive tract starts at the mouth and ends at the anal opening, so be   sure to relax both ends of the tube.  Place your tongue on the roof of your mouth with your teeth separated.  This helps relax your mouth and will help to relax the anus at the same time.  3) Empty (defecation) a) Keep your pelvic floor and sphincter relaxed, then bulge your anal muscles.  Make the anal opening wide.  b) Stick your belly out as if you have swallowed a beach ball. c) Make your belly wall hard using your belly muscles while continuing to breathe. Doing this makes it easier to open your anus. d) Breath out and give a grunt (or try using other sounds such as  ahhhh, shhhhh, ohhhh or grrrrrrr).  4) Finish a) As you finish your bowel movement, pull the pelvic floor muscles up and in.  This will leave your anus in the proper place rather than remaining pushed out and down. If you leave your anus pushed out and down, it will start to feel as though that is normal and give you incorrect signals about needing to have a bowel movement.   [facility] [address] , [place] [address]  

## 2017-10-03 ENCOUNTER — Ambulatory Visit: Payer: 59 | Admitting: Physical Therapy

## 2017-10-09 ENCOUNTER — Encounter: Payer: Self-pay | Admitting: Physical Therapy

## 2017-10-09 ENCOUNTER — Ambulatory Visit: Payer: 59 | Admitting: Physical Therapy

## 2017-10-09 DIAGNOSIS — R278 Other lack of coordination: Secondary | ICD-10-CM | POA: Diagnosis not present

## 2017-10-09 DIAGNOSIS — R252 Cramp and spasm: Secondary | ICD-10-CM

## 2017-10-09 NOTE — Therapy (Signed)
[facility] [address] [place], [place], [address] Phone: [phone]   Fax:  [phone]  Physical Therapy Treatment  Patient Details  Name: Gina [name] MRN: [id number] Date of Birth: [birth year] Referring Provider: Gatha Mayer, MD    Encounter Date: 10/09/2017  PT End of Session - 10/09/17 1620    Visit Number  2    Date for PT Re-Evaluation  11/21/17    PT Start Time  1618    PT Stop Time  1700    PT Time Calculation (min)  42 min    Activity Tolerance  Patient tolerated treatment well    Behavior During Therapy  [facility] for tasks assessed/performed       Past Medical History:  Diagnosis Date  . Chronic constipation w/ dyssynergic defecation by anorectal manometry 12/13/2011  . Constipation   . Dysphagia, oral phase   . Esophageal reflux   . Family history of malignant neoplasm of gastrointestinal tract   . Hiatal hernia   . Irritable bowel syndrome   . Unspecified vitamin D deficiency     Past Surgical History:  Procedure Laterality Date  . ABDOMINAL HYSTERECTOMY    . ANAL RECTAL MANOMETRY N/A 09/16/2017   Procedure: ANO RECTAL MANOMETRY;  Surgeon: Mauri Pole, MD;  Location: [facility];  Service: Endoscopy;  Laterality: N/A;  . COLONOSCOPY  multiple  . ESOPHAGEAL DILATION    . EYE SURGERY    . ILEOSTOMY    . ILEOSTOMY CLOSURE    . RECTOVAGINAL FISTULA CLOSURE    . SHOULDER SURGERY    . SHOULDER SURGERY    . UPPER GASTROINTESTINAL ENDOSCOPY      There were no vitals filed for this visit.  Subjective Assessment - 10/09/17 1733    Subjective  I am having 1-2 BM per day now.  I feel like my abdomen is not as hard when I do the abdominal massage    Pertinent History  hysterectomy    Limitations  Other (comment)    Patient Stated Goals  Be able to go 1x/day    Currently in Pain?  No/denies                      [facility] Adult PT Treatment/Exercise - 10/09/17 0001       Lumbar Exercises: Stretches   Active Hamstring Stretch  3 reps;30 seconds    Press Ups  5 reps;10 seconds    ITB Stretch  1 rep;30 seconds cross leg in supine    Piriformis Stretch  1 rep;30 seconds supine figure 4      Lumbar Exercises: Quadruped   Other Quadruped Lumbar Exercises  child's pose - 30 sec hold      Manual Therapy   Manual Therapy  Myofascial release    Myofascial Release  abdominal MFR and scar massage             PT Education - 10/09/17 1708    Education provided  Yes    Education Details  stretches    Person(s) Educated  Patient    Methods  Explanation;Demonstration;Handout;Verbal cues    Comprehension  Verbalized understanding;Returned demonstration       PT Short Term Goals - 10/09/17 1732      PT SHORT TERM GOAL #1   Title  pt will be ind with abdominal massage to scar tissue    Time  4    Period  Weeks  Status  Achieved      PT SHORT TERM GOAL #2   Title  pt will be able to have bowel movement with 25% more ease due to being able to correctly implement toileting techniques    Time  4    Period  Weeks    Status  On-going      PT SHORT TERM GOAL #3   Title  pt will have 20% less pain on average during her bowel movements due to increased soft tissue length    Period  Weeks    Status  On-going        PT Long Term Goals - 09/26/17 1511      PT LONG TERM GOAL #1   Title  pt will be ind with advanced HEP to manage symptoms and muscle spasms at home    Time  8    Period  Weeks    Status  New    Target Date  11/21/17      PT LONG TERM GOAL #2   Title  Pt will be able to have at least one BM per day due to decreased abdominal fascial adhesions and improved ability to relax pelvic floor    Time  8    Period  Weeks    Status  New    Target Date  11/21/17      PT LONG TERM GOAL #3   Title  pt will be able to have bowel movement without bearing down    Time  8    Period  Weeks    Status  New    Target Date  11/21/17      PT  LONG TERM GOAL #4   Title  Pt will have bowel movements that are at least circumference of a quarter due to increased ability to relax anal sphincters.    Time  8    Period  Weeks    Status  New    Target Date  11/21/17            Plan - 10/09/17 1728    Clinical Impression Statement  Pt present to clinic for initial treatment.  She reports she has tried the toilet techniques, abdominal massage and with changing some of the supplements as suggested by her doctor, she is having 1-2 BM every day.  States they are a little less than diameter of a quarter.  Pt has restrictions in scar tissue and had some fascial release during abdominal massage.  She demonstrates tight hips bilaterally and was educated in stretches.  Pt will benefit from skilled PT to continue working on improved soft tissue length and ensure good coordination so she will continue to have normal bowel movements.     Clinical Impairments Affecting Rehab Potential  hysterectomy, ileostomy open/closed, rectovaginal fistula closure, IBS    PT Treatment/Interventions  ADLs/Self Care Home Management;Biofeedback;Cryotherapy;Electrical Stimulation;Moist Heat;Therapeutic exercise;Therapeutic activities;Functional mobility training;Balance training;Patient/family education;Scar mobilization;Dry needling;Taping    PT Next Visit Plan  add to stretches, hip flexor and quad stretch, ball rolling under feet, self massage to LE with roller    PT Home Exercise Plan  progress as needed for at home self care    Consulted and Agree with Plan of Care  Patient       Patient will benefit from skilled therapeutic intervention in order to improve the following deficits and impairments:  Pain, Increased muscle spasms, Impaired tone, Decreased coordination  Visit Diagnosis: Other lack of coordination  Cramp and  spasm     Problem List Patient Active Problem List   Diagnosis Date Noted  . Family history of colon cancer 02/08/2017  . Abdominal  pain, right lower quadrant 12/17/2011  . Personal history of colonic polyps 12/17/2011  . Family history of colon cancer mother - 89's 12/13/2011  . RLQ abdominal pain 12/13/2011  . Chronic constipation w/ dyssynergic defecation by anorectal manometry 12/13/2011  . H/O resection of large bowel 12/13/2011  . Cervical spondylosis 12/13/2011  . CALLUS, FOOT 03/25/2009  . FOOT PAIN, BILATERAL 03/25/2009  . FIBROIDS, UTERUS 08/25/2008  . TOBACCO ABUSE 08/25/2008  . GERD 08/25/2008  . HIATAL HERNIA 08/25/2008  . IBS 08/25/2008    Zannie Cove, PT 10/09/2017, 5:34 PM  [facility] [address], [place], [place], [address] Phone: [phone]   Fax:  [phone]  Name: Gina [name] MRN: [id number] Date of Birth: June 13, 1961

## 2017-10-09 NOTE — Patient Instructions (Signed)
   HAMSTRING STRETCH WITH MULTI-LOOP STRAP  Lie on your back and place a stretching strap on your foot. Pull on the strap to assist in raising your leg up for a stretch to the back of your leg.   Keep your target leg straight to slightly bent the entire time.  Hold 30 sec, repeat 3x each side   PIRIFORMIS STRETCH  While lying on your back with both knee bent, cross your affected leg on the other knee.   Next, hold your unaffected thigh and pull it up towards your chest until a stretch is felt in the buttock.  Hold 30 sec, repeat 3x each side    Press up onto forearms and let low back and pelvis relax - hold 10 sec, repeat 5x      Child's Pose/Low Back Stretch   Get into hands and knees, bring your knees slightly wider than the hips, place the tops of your feet flat to the floor.  If this is uncomfortable, you can place a rolled towel under the top of your ankle.   As you inhale feel your pelvis open and spread.  As you exhale, slowly bring your hips back towards your heels, feeling a gentle stretch in the hips and low back.  Reach the tailbone towards the floor and with each exhalation, feel the lower back and hips relax.  You can stack your fists to support your head or reach your arms overhead on the floor.   Hold 30 sec repeat 3x  [facility] [address] [place], [place] [address] Phone # [phone] Fax [phone]

## 2017-10-23 ENCOUNTER — Ambulatory Visit: Payer: 59 | Attending: Internal Medicine | Admitting: Physical Therapy

## 2017-10-23 DIAGNOSIS — R252 Cramp and spasm: Secondary | ICD-10-CM

## 2017-10-23 DIAGNOSIS — R278 Other lack of coordination: Secondary | ICD-10-CM | POA: Insufficient documentation

## 2017-10-23 NOTE — Patient Instructions (Signed)
   Both legs up against wall taking deep breath in and bulge anus out, 1-2 minutes    Rest knees on pillows and breathe while relaxing pelvic floor do for 1-2 minutes    Stretch and relax while breathing in and bulging bottom down - 2 minutes/side   STOOL FOR COMMODE TO LIFT KNEES UP TOWARDS CHEST   [facility] [address] [place], [place] [address] Phone # [phone] Fax [phone]

## 2017-10-23 NOTE — Therapy (Signed)
[facility] [address] [place], [place], [address] Phone: [phone]   Fax:  [phone]  Physical Therapy Treatment  Patient Details  Name: Gina [name] MRN: [id number] Date of Birth: [birth year] Referring Provider: Gatha Mayer, MD    Encounter Date: 10/23/2017  PT End of Session - 10/23/17 1713    Visit Number  3    Date for PT Re-Evaluation  11/21/17    PT Start Time  1950    PT Stop Time  1700    PT Time Calculation (min)  43 min    Activity Tolerance  Patient tolerated treatment well    Behavior During Therapy  [facility] for tasks assessed/performed       Past Medical History:  Diagnosis Date  . Chronic constipation w/ dyssynergic defecation by anorectal manometry 12/13/2011  . Constipation   . Dysphagia, oral phase   . Esophageal reflux   . Family history of malignant neoplasm of gastrointestinal tract   . Hiatal hernia   . Irritable bowel syndrome   . Unspecified vitamin D deficiency     Past Surgical History:  Procedure Laterality Date  . ABDOMINAL HYSTERECTOMY    . ANAL RECTAL MANOMETRY N/A 09/16/2017   Procedure: ANO RECTAL MANOMETRY;  Surgeon: Mauri Pole, MD;  Location: [facility];  Service: Endoscopy;  Laterality: N/A;  . COLONOSCOPY  multiple  . ESOPHAGEAL DILATION    . EYE SURGERY    . ILEOSTOMY    . ILEOSTOMY CLOSURE    . RECTOVAGINAL FISTULA CLOSURE    . SHOULDER SURGERY    . SHOULDER SURGERY    . UPPER GASTROINTESTINAL ENDOSCOPY      There were no vitals filed for this visit.  Subjective Assessment - 10/23/17 1623    Subjective  I am doing okay having bowel movement but I have to push down hard to have a BM.  I was in the bathroom a lot last night.    Pertinent History  hysterectomy    Limitations  Other (comment)    Patient Stated Goals  Be able to go 1x/day    Currently in Pain?  No/denies                      [facility] Adult PT  Treatment/Exercise - 10/23/17 0001      Self-Care   Self-Care  Other Self-Care Comments    Other Self-Care Comments   different sitting positions with stool and breathing      Neuro Re-ed    Neuro Re-ed Details   biofeedback for relaxing pelvic floor muscles; stretching and breathing with piriformis stretch, hamstring against the wall, butterfly stretch      Lumbar Exercises: Stretches   Double Knee to Chest Stretch  1 rep;60 seconds did not increase muscle length    Prone Mid Back Stretch  1 rep;30 seconds did not increase pelvic length      Manual Therapy   Manual Therapy  Soft tissue mobilization    Soft tissue mobilization  glutes bilateral             PT Education - 10/23/17 1710    Education provided  Yes    Education Details  piriformis, hamstrings at wall, butterfly stretches, stool at commode       PT Short Term Goals - 10/09/17 1732      PT SHORT TERM GOAL #1   Title  pt will be ind with abdominal massage  to scar tissue    Time  4    Period  Weeks    Status  Achieved      PT SHORT TERM GOAL #2   Title  pt will be able to have bowel movement with 25% more ease due to being able to correctly implement toileting techniques    Time  4    Period  Weeks    Status  On-going      PT SHORT TERM GOAL #3   Title  pt will have 20% less pain on average during her bowel movements due to increased soft tissue length    Period  Weeks    Status  On-going        PT Long Term Goals - 10/23/17 1717      PT LONG TERM GOAL #1   Title  pt will be ind with advanced HEP to manage symptoms and muscle spasms at home    Time  8    Period  Weeks    Status  On-going            Plan - 10/23/17 1714    Clinical Impression Statement  Patient was able to get significantly decreased tone in pelvic floor demonstrated by biofeedback starting at over 12mV down to less than 58mV after stretching.  Pt demonstrates improved tone with feet on stool when sitting in position to have  BM.  Pt was educated on incorporating stretches and toilet techniques at home. She will benefit from skilled PT to continue working on improved control of pelvic floor.    Clinical Impairments Affecting Rehab Potential  hysterectomy, ileostomy open/closed, rectovaginal fistula closure, IBS    PT Treatment/Interventions  ADLs/Self Care Home Management;Biofeedback;Cryotherapy;Electrical Stimulation;Moist Heat;Therapeutic exercise;Therapeutic activities;Functional mobility training;Balance training;Patient/family education;Scar mobilization;Dry needling;Taping    PT Next Visit Plan  biofeedback for more bulging on pball, add to stretches if needed, hip flexor and quad stretch, ball rolling under feet, self massage to LE with roller    PT Home Exercise Plan  progress as needed for at home self care    Consulted and Agree with Plan of Care  Patient       Patient will benefit from skilled therapeutic intervention in order to improve the following deficits and impairments:  Pain, Increased muscle spasms, Impaired tone, Decreased coordination  Visit Diagnosis: Other lack of coordination  Cramp and spasm     Problem List Patient Active Problem List   Diagnosis Date Noted  . Family history of colon cancer 02/08/2017  . Abdominal pain, right lower quadrant 12/17/2011  . Personal history of colonic polyps 12/17/2011  . Family history of colon cancer mother - 34's 12/13/2011  . RLQ abdominal pain 12/13/2011  . Chronic constipation w/ dyssynergic defecation by anorectal manometry 12/13/2011  . H/O resection of large bowel 12/13/2011  . Cervical spondylosis 12/13/2011  . CALLUS, FOOT 03/25/2009  . FOOT PAIN, BILATERAL 03/25/2009  . FIBROIDS, UTERUS 08/25/2008  . TOBACCO ABUSE 08/25/2008  . GERD 08/25/2008  . HIATAL HERNIA 08/25/2008  . IBS 08/25/2008    Zannie Cove, PT 10/23/2017, 5:17 PM  [facility] [address] [place], [place], [address] Phone: [phone]   Fax:  [phone]  Name: ASENETH HACK MRN: [id number] Date of Birth: 08/23/1961

## 2017-10-28 ENCOUNTER — Ambulatory Visit: Payer: 59 | Admitting: Physical Therapy

## 2017-10-28 DIAGNOSIS — R252 Cramp and spasm: Secondary | ICD-10-CM

## 2017-10-28 DIAGNOSIS — R278 Other lack of coordination: Secondary | ICD-10-CM | POA: Diagnosis not present

## 2017-10-28 NOTE — Therapy (Addendum)
[facility] [address], [place], [place], [address] Phone: [phone]   Fax:  [phone]  Physical Therapy Treatment  Patient Details  Name: Gina [name] MRN: [id number] Date of Birth: 15-Sep-1961 Referring Provider: Gatha Mayer, MD    Encounter Date: 10/28/2017  PT End of Session - 10/28/17 1713    Visit Number  4    Date for PT Re-Evaluation  11/21/17    PT Start Time  2229    PT Stop Time  1704    PT Time Calculation (min)  47 min    Activity Tolerance  Patient tolerated treatment well    Behavior During Therapy  [facility] for tasks assessed/performed       Past Medical History:  Diagnosis Date  . Chronic constipation w/ dyssynergic defecation by anorectal manometry 12/13/2011  . Constipation   . Dysphagia, oral phase   . Esophageal reflux   . Family history of malignant neoplasm of gastrointestinal tract   . Hiatal hernia   . Irritable bowel syndrome   . Unspecified vitamin D deficiency     Past Surgical History:  Procedure Laterality Date  . ABDOMINAL HYSTERECTOMY    . ANAL RECTAL MANOMETRY N/A 09/16/2017   Procedure: ANO RECTAL MANOMETRY;  Surgeon: Mauri Pole, MD;  Location: [facility];  Service: Endoscopy;  Laterality: N/A;  . COLONOSCOPY  multiple  . ESOPHAGEAL DILATION    . EYE SURGERY    . ILEOSTOMY    . ILEOSTOMY CLOSURE    . RECTOVAGINAL FISTULA CLOSURE    . SHOULDER SURGERY    . SHOULDER SURGERY    . UPPER GASTROINTESTINAL ENDOSCOPY      There were no vitals filed for this visit.  Subjective Assessment - 10/28/17 1624    Subjective  I can tell when I am relaxed and not pushing as much. I have been going at least 3x/day.  My stool is not pencil thin, but still more on the thin size.    Pertinent History  hysterectomy    Limitations  Other (comment)    Patient Stated Goals  Be able to go 1x/day    Currently in Pain?  No/denies                       [facility] Adult PT Treatment/Exercise - 10/28/17 0001      Neuro Re-ed    Neuro Re-ed Details   contract relax to puborectalis, relaxing puborectalis with bulging pelvic floor      Manual Therapy   Manual Therapy  Internal Pelvic Floor    Manual therapy comments  pt informed and consent given to perform internal soft    Soft tissue mobilization  glutes bilateral, lumbar paraspinals bilateral    Internal Pelvic Floor  puborectalis, coccygeus, levators, anal sphincters internal and external , obdurator internus bilateral               PT Short Term Goals - 10/09/17 1732      PT SHORT TERM GOAL #1   Title  pt will be ind with abdominal massage to scar tissue    Time  4    Period  Weeks    Status  Achieved      PT SHORT TERM GOAL #2   Title  pt will be able to have bowel movement with 25% more ease due to being able to correctly implement toileting techniques    Time  4  Period  Weeks    Status  On-going      PT SHORT TERM GOAL #3   Title  pt will have 20% less pain on average during her bowel movements due to increased soft tissue length    Period  Weeks    Status  On-going        PT Long Term Goals - 10/23/17 1717      PT LONG TERM GOAL #1   Title  pt will be ind with advanced HEP to manage symptoms and muscle spasms at home    Time  8    Period  Weeks    Status  On-going            Plan - 10/28/17 1710    Clinical Impression Statement  Patient responded well to trigger point and soft tissue release to pelvic floor and soft tissue of gluteal and lumbar regions.  Pt still tightens puborectalis slightly when attempting to bulge pelvic floor when trying to push one finger out.  Pt benefits from skilled PT to work toward functional goals.    Rehab Potential  Good    Clinical Impairments Affecting Rehab Potential  hysterectomy, ileostomy open/closed, rectovaginal fistula closure, IBS    PT Treatment/Interventions  ADLs/Self Care  Home Management;Biofeedback;Cryotherapy;Electrical Stimulation;Moist Heat;Therapeutic exercise;Therapeutic activities;Functional mobility training;Balance training;Patient/family education;Scar mobilization;Dry needling;Taping    PT Next Visit Plan  bulging on physioball, STM internally to puborectalis    PT Home Exercise Plan  progress as needed for at home self care    Consulted and Agree with Plan of Care  Patient       Patient will benefit from skilled therapeutic intervention in order to improve the following deficits and impairments:  Pain, Increased muscle spasms, Impaired tone, Decreased coordination  Visit Diagnosis: Other lack of coordination  Cramp and spasm     Problem List Patient Active Problem List   Diagnosis Date Noted  . Family history of colon cancer 02/08/2017  . Abdominal pain, right lower quadrant 12/17/2011  . Personal history of colonic polyps 12/17/2011  . Family history of colon cancer mother - 18's 12/13/2011  . RLQ abdominal pain 12/13/2011  . Chronic constipation w/ dyssynergic defecation by anorectal manometry 12/13/2011  . H/O resection of large bowel 12/13/2011  . Cervical spondylosis 12/13/2011  . CALLUS, FOOT 03/25/2009  . FOOT PAIN, BILATERAL 03/25/2009  . FIBROIDS, UTERUS 08/25/2008  . TOBACCO ABUSE 08/25/2008  . GERD 08/25/2008  . HIATAL HERNIA 08/25/2008  . IBS 08/25/2008    Zannie Cove, PT 10/28/2017, 5:15 PM  [facility] [address] [place], [place], [address] Phone: [phone]   Fax:  [phone]  Name: Gina [name] MRN: [id number] Date of Birth: 1961-07-21  PHYSICAL THERAPY DISCHARGE SUMMARY  Visits from Start of Care: 4  Current functional level related to goals / functional outcomes: See above remaining goals   Remaining deficits: See above   Education / Equipment: HEP Plan: Patient agrees to discharge.  Patient goals were not met. Patient  is being discharged due to not returning since the last visit.  ?????     [employer], PT 12/25/17 10:07 AM

## 2017-10-30 ENCOUNTER — Encounter: Payer: Self-pay | Admitting: Physical Therapy

## 2017-11-04 ENCOUNTER — Encounter: Payer: Self-pay | Admitting: Physical Therapy

## 2017-11-06 ENCOUNTER — Encounter: Payer: Self-pay | Admitting: Physical Therapy

## 2017-11-07 ENCOUNTER — Ambulatory Visit: Payer: Self-pay | Admitting: Internal Medicine

## 2017-11-11 ENCOUNTER — Encounter: Payer: Self-pay | Admitting: Physical Therapy

## 2017-11-13 ENCOUNTER — Ambulatory Visit: Payer: 59 | Admitting: Physical Therapy

## 2017-11-18 ENCOUNTER — Ambulatory Visit: Payer: 59 | Admitting: Physical Therapy

## 2017-11-20 ENCOUNTER — Encounter: Payer: Self-pay | Admitting: Physical Therapy

## 2017-12-30 ENCOUNTER — Ambulatory Visit: Payer: Self-pay | Admitting: Internal Medicine

## 2018-01-17 DIAGNOSIS — L259 Unspecified contact dermatitis, unspecified cause: Secondary | ICD-10-CM | POA: Diagnosis not present

## 2018-01-28 DIAGNOSIS — L249 Irritant contact dermatitis, unspecified cause: Secondary | ICD-10-CM | POA: Diagnosis not present

## 2018-02-21 DIAGNOSIS — L249 Irritant contact dermatitis, unspecified cause: Secondary | ICD-10-CM | POA: Diagnosis not present

## 2018-02-21 DIAGNOSIS — D235 Other benign neoplasm of skin of trunk: Secondary | ICD-10-CM | POA: Diagnosis not present

## 2018-03-13 DIAGNOSIS — M71571 Other bursitis, not elsewhere classified, right ankle and foot: Secondary | ICD-10-CM | POA: Diagnosis not present

## 2018-03-13 DIAGNOSIS — M71572 Other bursitis, not elsewhere classified, left ankle and foot: Secondary | ICD-10-CM | POA: Diagnosis not present

## 2018-03-20 ENCOUNTER — Other Ambulatory Visit: Payer: Self-pay | Admitting: Internal Medicine

## 2018-03-20 DIAGNOSIS — Z1231 Encounter for screening mammogram for malignant neoplasm of breast: Secondary | ICD-10-CM

## 2018-04-09 ENCOUNTER — Ambulatory Visit: Payer: Self-pay

## 2018-06-04 DIAGNOSIS — I2510 Atherosclerotic heart disease of native coronary artery without angina pectoris: Secondary | ICD-10-CM | POA: Diagnosis not present

## 2018-06-04 DIAGNOSIS — N390 Urinary tract infection, site not specified: Secondary | ICD-10-CM | POA: Diagnosis not present

## 2018-06-04 DIAGNOSIS — E7800 Pure hypercholesterolemia, unspecified: Secondary | ICD-10-CM | POA: Diagnosis not present

## 2018-06-04 DIAGNOSIS — E559 Vitamin D deficiency, unspecified: Secondary | ICD-10-CM | POA: Diagnosis not present

## 2018-06-11 DIAGNOSIS — F17200 Nicotine dependence, unspecified, uncomplicated: Secondary | ICD-10-CM | POA: Diagnosis not present

## 2018-06-11 DIAGNOSIS — J449 Chronic obstructive pulmonary disease, unspecified: Secondary | ICD-10-CM | POA: Diagnosis not present

## 2018-06-11 DIAGNOSIS — Z0000 Encounter for general adult medical examination without abnormal findings: Secondary | ICD-10-CM | POA: Diagnosis not present

## 2018-06-12 ENCOUNTER — Ambulatory Visit
Admission: RE | Admit: 2018-06-12 | Discharge: 2018-06-12 | Disposition: A | Discharge status: Home / Self Care | Payer: 59 | Source: Ambulatory Visit | Attending: Internal Medicine | Admitting: Internal Medicine

## 2018-06-12 DIAGNOSIS — Z1231 Encounter for screening mammogram for malignant neoplasm of breast: Secondary | ICD-10-CM

## 2018-07-01 ENCOUNTER — Other Ambulatory Visit: Payer: Self-pay | Admitting: Internal Medicine

## 2018-07-01 DIAGNOSIS — F17200 Nicotine dependence, unspecified, uncomplicated: Secondary | ICD-10-CM

## 2018-07-08 ENCOUNTER — Encounter: Payer: Self-pay | Admitting: Podiatry

## 2018-07-08 ENCOUNTER — Ambulatory Visit: Payer: 59 | Admitting: Podiatry

## 2018-07-08 ENCOUNTER — Encounter: Payer: Self-pay | Admitting: *Deleted

## 2018-07-08 DIAGNOSIS — Q66222 Congenital metatarsus adductus, left foot: Principal | ICD-10-CM

## 2018-07-08 DIAGNOSIS — M24571 Contracture, right ankle: Secondary | ICD-10-CM | POA: Diagnosis not present

## 2018-07-08 DIAGNOSIS — Q6622 Congenital metatarsus adductus: Secondary | ICD-10-CM

## 2018-07-08 DIAGNOSIS — M7742 Metatarsalgia, left foot: Secondary | ICD-10-CM

## 2018-07-08 DIAGNOSIS — M7741 Metatarsalgia, right foot: Secondary | ICD-10-CM

## 2018-07-08 DIAGNOSIS — Q66221 Congenital metatarsus adductus, right foot: Secondary | ICD-10-CM

## 2018-07-08 NOTE — Patient Instructions (Signed)
Seen for painful feet. 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 Reviewed findings. Lesions debrided. Equinus brace fitted with instruction. May benefit from custom orthotics. Will call for coverage info. Return in one month for follow up on equinus brace.

## 2018-07-08 NOTE — Progress Notes (Signed)
SUBJECTIVE: 57 y.o. year old female presents with painful feet duration of over 10 years. Has received injection 3-4 months ago that resulted in more pain. On feet off and on at work. Patient is wearing old orthotics inside her steel toed shoes.  Review of Systems  Constitutional: Negative.   HENT: Negative.   Eyes: Negative.   Respiratory: Negative.   Cardiovascular: Negative.   Gastrointestinal: Negative.   Genitourinary: Negative.   Musculoskeletal: Negative.   Skin: Negative.      OBJECTIVE: DERMATOLOGIC EXAMINATION: Porokeratotic lesion under 1st and 5th MPJ plantar bilateral symptomatic. Painful digital corn 5th right.  VASCULAR EXAMINATION OF LOWER LIMBS: All pedal pulses are palpable with normal pulsation.  Capillary Filling times within 3 seconds in all digits.  No edema or erythema noted. Temperature gradient from tibial crest to dorsum of foot is within normal bilateral.  NEUROLOGIC EXAMINATION OF THE LOWER LIMBS: All epicritic and tactile sensations grossly intact. Sharp and Dull discriminatory sensations at the plantar ball of hallux is intact bilateral.   MUSCULOSKELETAL EXAMINATION: Contracted 5th digit right. Positive for High arched Cavus type foot with inverted heel position, adducted forefoot, excess sagittal plane motion of the first ray bilateral, tight Achilles tendon on both lower limbs, with knee extended right side is 0 and left side is 5 dorsiflexion from right angle at ankle joint.Marland Kitchen  RADIOGRAPHIC STUDIES:  AP View:  Severe adducted metatarsal bones, short first metatarsal bilateral. Lateral view:  High arched supinated foot with normal CYMA line bilateral.    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  PLAN: Reviewed findings and available treatment options. All lesions debrided. May benefit from a new pair  custom orthotics with accommodated lesion pad. Equinus brace fitted and dispensed with instruction.  Equinus Brace Fitting and Dispensing Document: The plastic custom fitted Ankle Foot Orthosis was assembled in accordance with the manufacturer's instructions and based on the specific anatomical and physiological requirements of the patient. This device was custom fitted by me with expertise to provide this service and dispensed for the right foot. The patient was examined while wearing the device after several fitting maneuvers of trimming, bending, shaping, etc. and the fit was found to be appropriate.    Medical necessity: Due to the pain in the foot/ankle/leg with weight bearing throughout the day, with diagnosis of equinus deformity and related symptoms, this is medically necessary for the treatment.  The function of this device is to serve as an anti contracture device of the Gastrocsoleal complex and to restrict and limit motion and help reduce excessive stress and strain to [employer] complex and foot/ankle/leg. It is being utilized to prevent the plantar contracture of the Gastrocsoleal complex.   The goal of the therapy are to:  1. Treat plantarflexion contracture of the ankle with dorsiflexion, the ankle on passive range of motion testing is noted to have at least 10 degrees (I.e, a non-fixed contracture). 2. Provide a reasonable expectation of the ability to correct the contracture.  3. Reduce the significantly with the beneficiary's functional abilities.  4. Used as a component of a therapy program which includes active stretching of the involved muscles and/or tendons. 5. Treat the beneficiary's plantar fasciitis. Additionally, medial and lateral ankle dorsiflex assistive and plantarflex restraint hinges are included on the brace and were set at 90 degrees of dorsiflexion. These will be periodically adjusted based on future physical examination of the patient's  progress.  Instructions and Goals for the Patient: The device will be utilized for the next 8-12 weeks. The intent of these hinges is to resist plantarflexion and assist with dorsiflexion of the Gastrocsoleal complex and/or plantar fascia. These hinges will be adjusted over the course of the patient's therapy. The patient was instructed not to adjust the hinges. Patient was advised to bring the device to the office on next visit for further evaluation and adjustments.  The goals and function of this device was explained in detail to the patient. The patient stated that the device was comfortable when applied. The patient was shown and told in detail how to properly apply, remove, wear, and care for the device. The patient was able to apply and remove the device properly without assistance. Patient as advised not to ambulate with the device in place.  The device was then dispensed and was suitable for the condition and not substandard. No guarantees regarding resolution of symptoms were given and precautions were reviewed.  Written instructions and warranty information were given along with the list of the current [place] Standards.  The patient signed written proof of delivery. All questions were answered to the patient's satisfaction. The patent also given a follow up appointment in one month.

## 2018-07-24 ENCOUNTER — Ambulatory Visit (INDEPENDENT_AMBULATORY_CARE_PROVIDER_SITE_OTHER)
Admission: RE | Admit: 2018-07-24 | Discharge: 2018-07-24 | Disposition: A | Discharge status: Home / Self Care | Payer: 59 | Source: Ambulatory Visit | Attending: Acute Care | Admitting: Acute Care

## 2018-07-24 DIAGNOSIS — F17210 Nicotine dependence, cigarettes, uncomplicated: Secondary | ICD-10-CM | POA: Diagnosis not present

## 2018-07-24 DIAGNOSIS — Z122 Encounter for screening for malignant neoplasm of respiratory organs: Secondary | ICD-10-CM

## 2018-08-01 NOTE — Progress Notes (Signed)
LMOMTCB x 1 

## 2018-08-01 NOTE — Progress Notes (Signed)
Patient returned call.  Advised of LDCT results / recs as stated by Judson Roch NP.  Pt verbalized understanding and denied any questions.  Results faxed to PCP.  Denise to order LDCT for 07/2019.

## 2018-08-05 ENCOUNTER — Other Ambulatory Visit: Payer: Self-pay | Admitting: Acute Care

## 2018-08-05 DIAGNOSIS — Z122 Encounter for screening for malignant neoplasm of respiratory organs: Secondary | ICD-10-CM

## 2018-08-05 DIAGNOSIS — F17210 Nicotine dependence, cigarettes, uncomplicated: Secondary | ICD-10-CM

## 2018-08-14 ENCOUNTER — Encounter: Payer: Self-pay | Admitting: Podiatry

## 2018-08-14 ENCOUNTER — Ambulatory Visit: Payer: 59 | Admitting: Podiatry

## 2018-08-14 ENCOUNTER — Encounter: Payer: Self-pay | Admitting: *Deleted

## 2018-08-14 DIAGNOSIS — M7742 Metatarsalgia, left foot: Secondary | ICD-10-CM

## 2018-08-14 DIAGNOSIS — M7741 Metatarsalgia, right foot: Secondary | ICD-10-CM

## 2018-08-14 DIAGNOSIS — Q828 Other specified congenital malformations of skin: Secondary | ICD-10-CM | POA: Diagnosis not present

## 2018-08-14 NOTE — Patient Instructions (Signed)
Seen for painful feet. Painful calluses debrided. Equinus brace adjusted to full dorsiflexion.

## 2018-08-14 NOTE — Progress Notes (Signed)
SUBJECTIVE: 57 y.o. year old female presents complaining of swelling for the last couple of days and been painful. Last trimming of callus really helped. Brace is not stretching effectively.  History of painful feet duration of over 10 years. Has received injection 3-4 months ago that resulted in more pain. On feet off and on at work. Patient is wearing old orthotics inside her steel toed shoes.   OBJECTIVE: DERMATOLOGIC EXAMINATION: Porokeratotic lesion under 1st and 5th MPJ plantar bilateral symptomatic. Painful digital corn 5th right.  VASCULAR EXAMINATION OF LOWER LIMBS: Normal findings. No edema or erythema noted.  NEUROLOGIC EXAMINATION OF THE LOWER LIMBS: All epicritic and tactile sensations grossly intact. Sharp and Dull discriminatory sensations at the plantar ball of hallux is intact bilateral.   MUSCULOSKELETAL EXAMINATION: Contracted 5th digit right. Positive for High arched Cavus type foot with inverted heel position, adducted forefoot, excess sagittal plane motion of the first ray bilateral, tight Achilles tendon on both lower limbs, with knee extended right side is 0 and left side is 5 dorsiflexion from right angle at ankle joint..   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 Painful Porokeratosis sub 1 and 5 bilateral.  PLAN: Reviewed findings and available treatment options. All lesions debrided. Equinus brace adjusted to full dorsiflexion position.

## 2018-09-11 DIAGNOSIS — H6122 Impacted cerumen, left ear: Secondary | ICD-10-CM | POA: Diagnosis not present

## 2018-09-11 DIAGNOSIS — F17200 Nicotine dependence, unspecified, uncomplicated: Secondary | ICD-10-CM | POA: Diagnosis not present

## 2018-09-11 DIAGNOSIS — J343 Hypertrophy of nasal turbinates: Secondary | ICD-10-CM | POA: Diagnosis not present

## 2018-10-21 DIAGNOSIS — J449 Chronic obstructive pulmonary disease, unspecified: Secondary | ICD-10-CM | POA: Diagnosis not present

## 2018-10-21 DIAGNOSIS — F17200 Nicotine dependence, unspecified, uncomplicated: Secondary | ICD-10-CM | POA: Diagnosis not present

## 2018-12-23 DIAGNOSIS — N390 Urinary tract infection, site not specified: Secondary | ICD-10-CM | POA: Diagnosis not present

## 2018-12-23 DIAGNOSIS — M545 Low back pain: Secondary | ICD-10-CM | POA: Diagnosis not present

## 2018-12-23 DIAGNOSIS — F17200 Nicotine dependence, unspecified, uncomplicated: Secondary | ICD-10-CM | POA: Diagnosis not present

## 2019-05-13 IMAGING — MG DIGITAL SCREENING BILATERAL MAMMOGRAM WITH TOMO AND CAD
8 series · 8 of 24 positions shown · non-contrast
Comparison: Previous exam(s).

CLINICAL DATA: Screening.

EXAM:
DIGITAL SCREENING BILATERAL MAMMOGRAM WITH TOMO AND CAD

[L CC synth-2D]
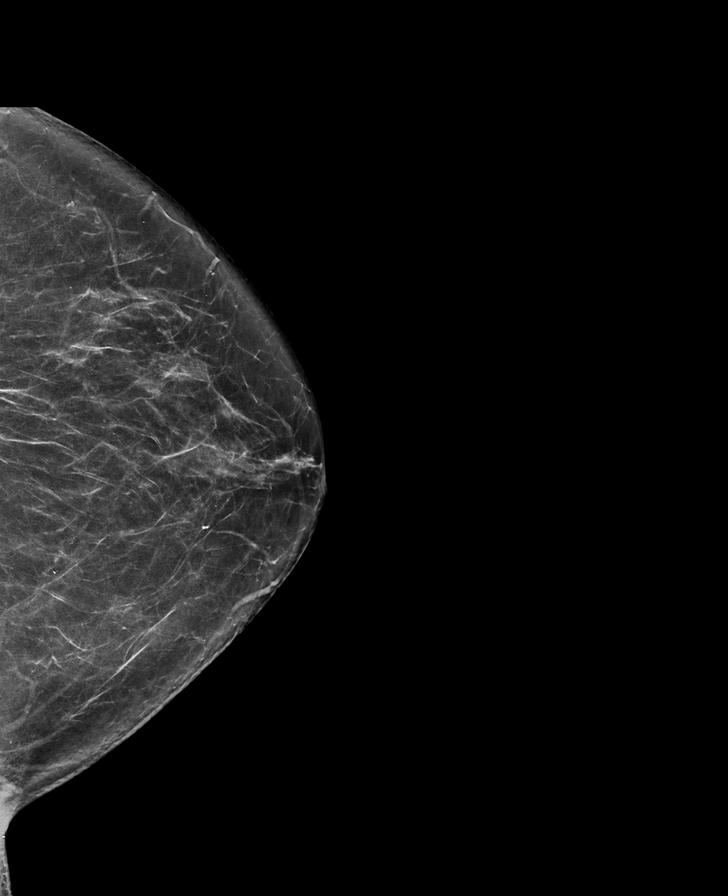

[L MLO synth-2D]
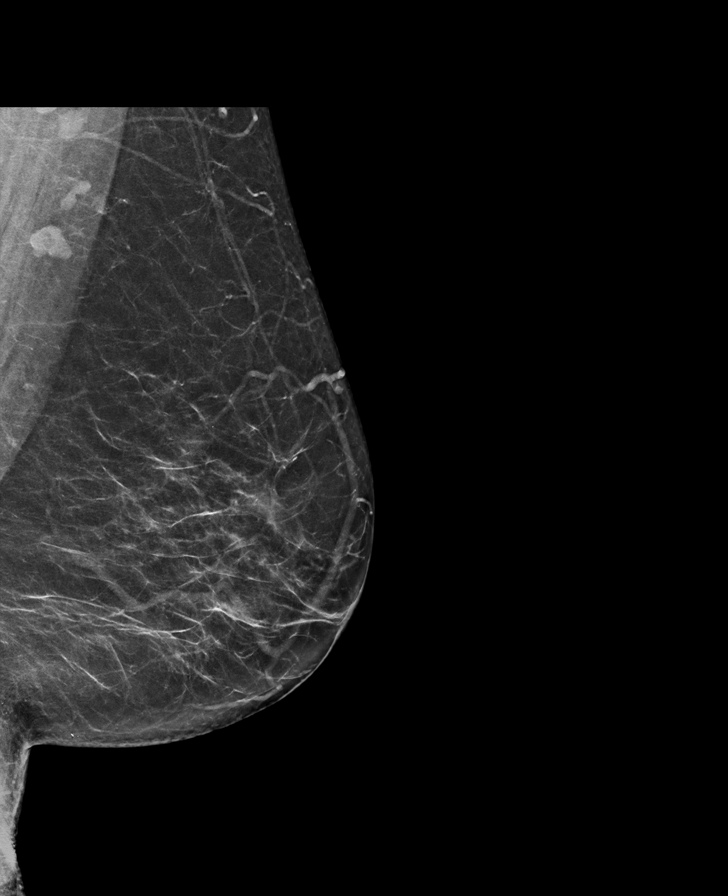

[R CC synth-2D]
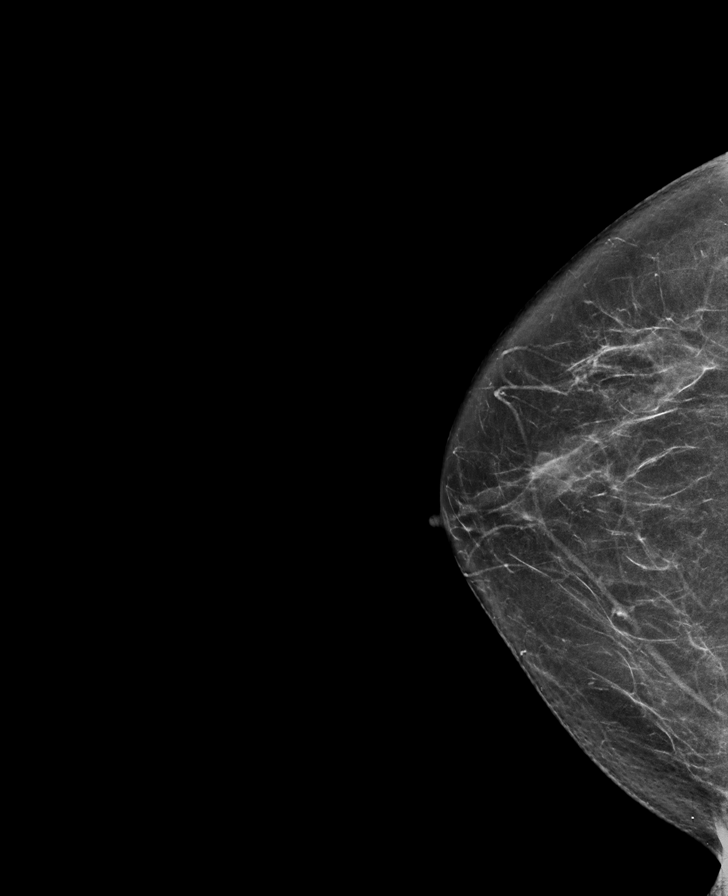

[R MLO synth-2D]
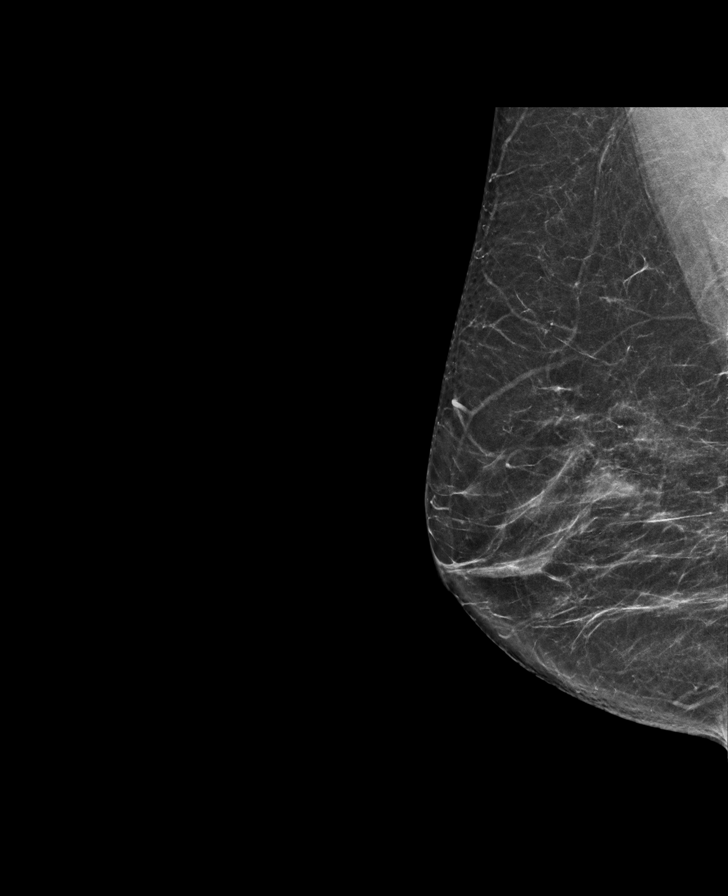

[L CC tomo · tomo slice 35/70.0]
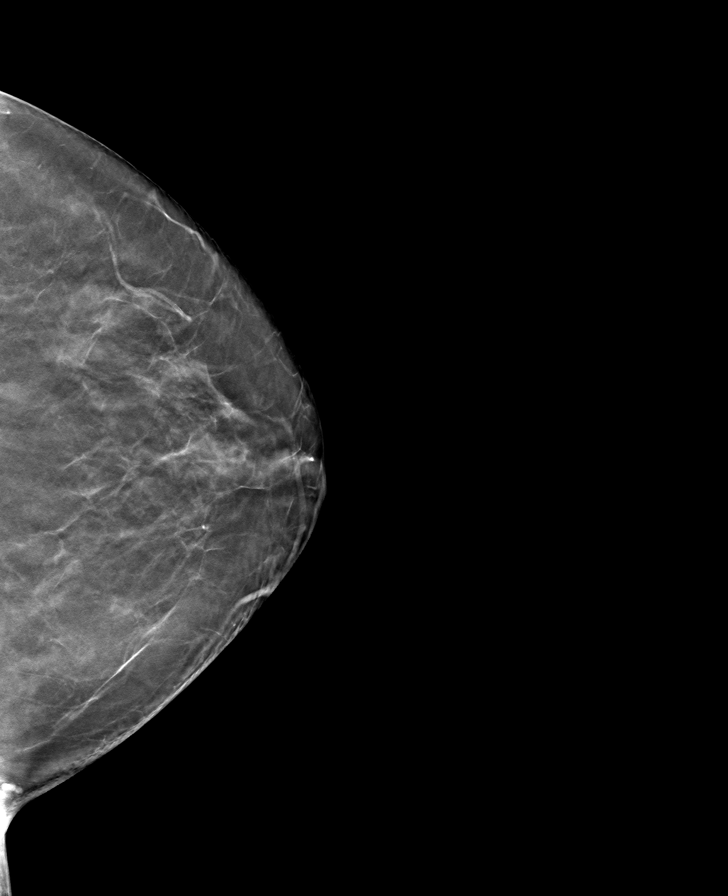

[L MLO tomo · tomo slice 37/74.0]
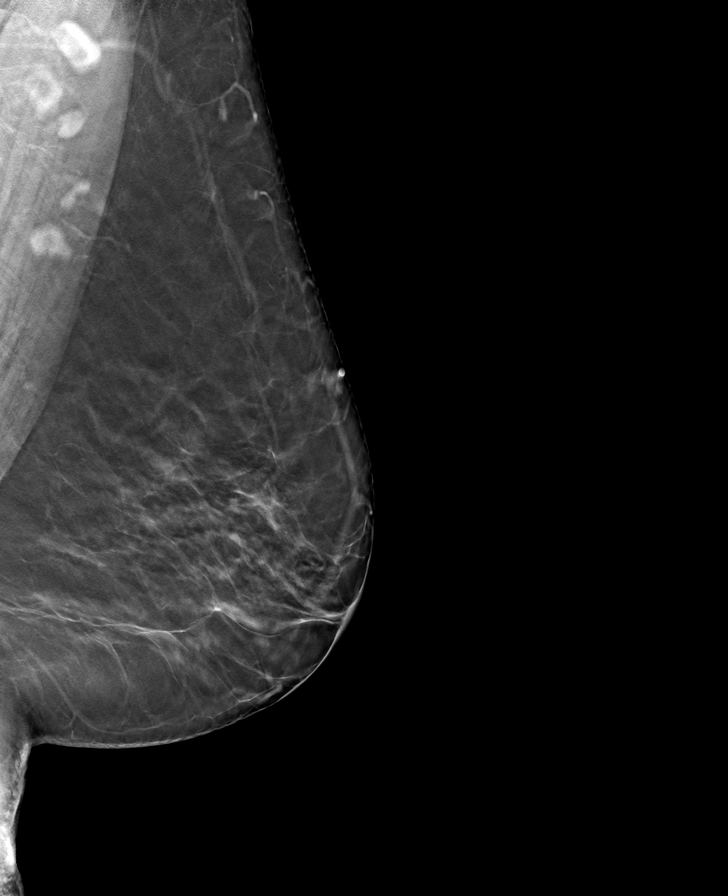

[R CC tomo · tomo slice 35/68.0]
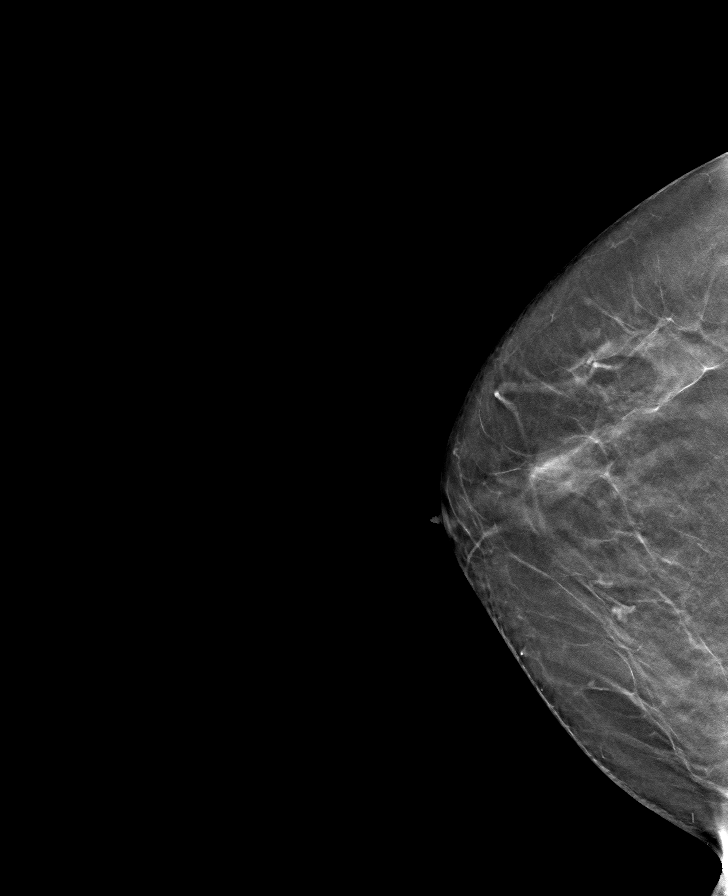

[R MLO tomo · tomo slice 35/68.0]
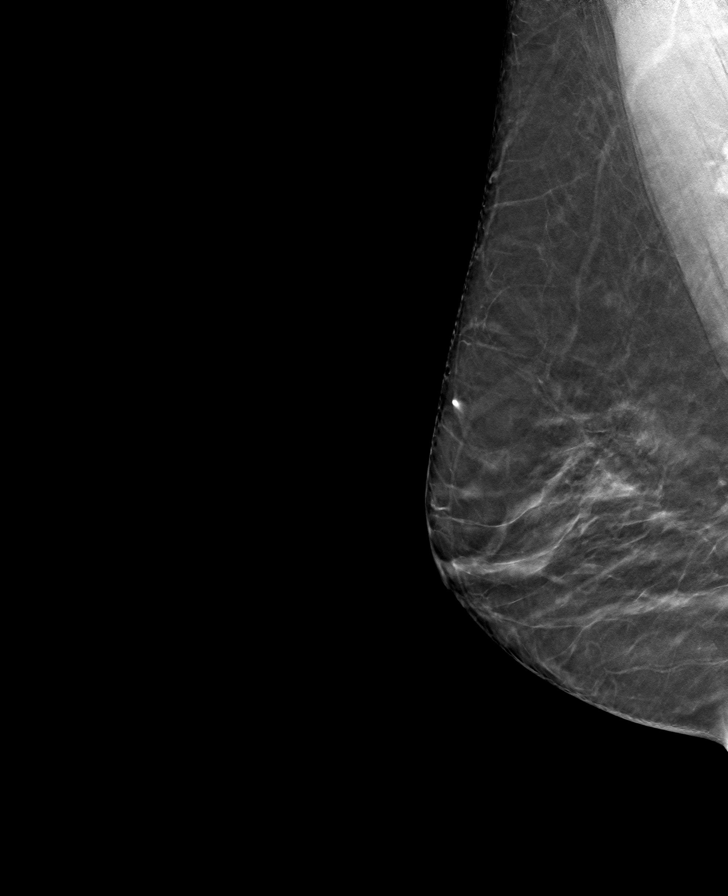

[8 of 24 positions shown; findings below may reference images not displayed]

ACR Breast Density Category b: There are scattered areas of
fibroglandular density.
FINDINGS: There are no findings suspicious for malignancy. Images were
processed with CAD.
IMPRESSION: No mammographic evidence of malignancy. A result letter of this
screening mammogram will be mailed directly to the patient.

RECOMMENDATION:
Screening mammogram in one year. (Code:[id number])

BI-RADS CATEGORY  1: Negative.

## 2019-06-24 ENCOUNTER — Other Ambulatory Visit: Payer: Self-pay | Admitting: Internal Medicine

## 2019-06-24 DIAGNOSIS — F17200 Nicotine dependence, unspecified, uncomplicated: Secondary | ICD-10-CM

## 2019-07-01 ENCOUNTER — Other Ambulatory Visit: Payer: Self-pay

## 2019-07-01 ENCOUNTER — Ambulatory Visit
Admission: RE | Admit: 2019-07-01 | Discharge: 2019-07-01 | Disposition: A | Discharge status: Home / Self Care | Payer: Self-pay | Source: Ambulatory Visit | Attending: Internal Medicine | Admitting: Internal Medicine

## 2019-07-01 DIAGNOSIS — F17200 Nicotine dependence, unspecified, uncomplicated: Secondary | ICD-10-CM

## 2019-07-07 ENCOUNTER — Telehealth: Payer: Self-pay | Admitting: Acute Care

## 2019-07-07 DIAGNOSIS — F17210 Nicotine dependence, cigarettes, uncomplicated: Secondary | ICD-10-CM

## 2019-07-07 DIAGNOSIS — Z122 Encounter for screening for malignant neoplasm of respiratory organs: Secondary | ICD-10-CM

## 2019-07-07 NOTE — Telephone Encounter (Signed)
Please call patient and let them  know their  low dose Ct was read as a Lung RADS 2: nodules that are benign in appearance and behavior with a very low likelihood of becoming a clinically active cancer due to size or lack of growth. Recommendation per radiology is for a repeat LDCT in 12 months..Please let them  know we will order and schedule their  annual screening scan for 06/2020. Please let them  know there was notation of CAD on their  scan.  Please remind the patient  that this is a non-gated exam therefore degree or severity of disease  cannot be determined. Please have them  follow up with their PCP regarding potential risk factor modification, dietary therapy or pharmacologic therapy if clinically indicated. Pt.  is not  currently on statin therapy. Please place order for annual  screening scan for  06/2020 and fax results to PCP. Thanks so much.  Langley Gauss and [place]. Please place order for follow up Ct 06/2019>> we may need to call Dr. Pennie Banter office if he continues to go rogue. Thanks

## 2019-07-10 NOTE — Telephone Encounter (Signed)
LMTC x 1  

## 2019-07-10 NOTE — Telephone Encounter (Signed)
Pt informed of CT results per Sarah Groce, NP.  PT verbalized understanding.  Copy sent to PCP.  Order placed for 1 yr f/u CT.  

## 2019-09-03 ENCOUNTER — Telehealth: Payer: Self-pay | Admitting: Cardiology

## 2019-09-09 ENCOUNTER — Encounter: Payer: Self-pay | Admitting: Cardiology

## 2019-09-09 ENCOUNTER — Other Ambulatory Visit: Payer: Self-pay

## 2019-09-09 ENCOUNTER — Ambulatory Visit (INDEPENDENT_AMBULATORY_CARE_PROVIDER_SITE_OTHER): Payer: 59 | Admitting: Cardiology

## 2019-09-09 VITALS — BP 122/77 | HR 77 | Ht 68.0 in | Wt 206.9 lb

## 2019-09-09 DIAGNOSIS — F17200 Nicotine dependence, unspecified, uncomplicated: Secondary | ICD-10-CM | POA: Diagnosis not present

## 2019-09-09 DIAGNOSIS — M79604 Pain in right leg: Secondary | ICD-10-CM | POA: Diagnosis not present

## 2019-09-09 DIAGNOSIS — I2510 Atherosclerotic heart disease of native coronary artery without angina pectoris: Secondary | ICD-10-CM

## 2019-09-09 MED ORDER — ROSUVASTATIN CALCIUM 20 MG PO TABS: 20.0000 mg | ORAL_TABLET | Freq: Every day | ORAL | 3 refills | Status: DC

## 2019-09-09 MED ORDER — ASPIRIN EC 81 MG PO TBEC: 81.0000 mg | DELAYED_RELEASE_TABLET | Freq: Every day | ORAL | 3 refills | Status: DC

## 2019-09-09 NOTE — Patient Instructions (Signed)
Diet & Lifestyle recommendations:  Physical activity recommendation (The Physical Activity Guidelines for Americans. JAMA 2018;Nov 12) At least 150-300 minutes a week of moderate-intensity, or 75-150 minutes a week of vigorous-intensity aerobic physical activity, or an equivalent combination of moderate- and vigorous-intensity aerobic activity. Adults should perform muscle-strengthening activities on 2 or more days a week. Older adults should do multicomponent physical activity that includes balance training as well as aerobic and muscle-strengthening activities. Benefits of increased physical activity include lower risk of mortality including cardiovascular mortality, lower risk of cardiovascular events and associated risk factors (hypertension and diabetes), and lower risk of many cancers (including bladder, breast, colon, endometrium, esophagus, kidney, lung, and stomach). Additional improvments have been seen in cognition, risk of dementia, anxiety and depression, improved bone health, lower risk of falls, and associated injuries.  Dietary recommendation The 2019 ACC/AHA guidelines promote nutrition as a main fixture of cardiovascular wellness, with a recommendation for a varied diet of fruit, vegetables, fish, legumes, and whole grains (Class I), as well as recommendations to reduce sodium, cholesterol, processed meats, and refined sugars (Class IIa recommendation).10 Sodium intake, a topic of some controversy as of late, is recommended to be kept at 1,500 mg/day or less, far below the average daily intake in the [place] of 3,409 mg/day, and notably below that of previous [place] recommendations for 300mg /day.10,11 For those unable to reach 1,500 mg/day, they recommend at least a reduction of 1000 mg/day.  A Pesco-Mediterranean Diet With Intermittent Fasting: JACC Review Topic of the Week. J Am Coll Cardiol 5974;16:3845-3646 Pesco-Mediterranean diet, it is supplemented with extra-virgin olive oil (EVOO),  which is the principle fat source, along with moderate amounts of dairy (particularly yogurt and cheese) and eggs, as well as modest amounts of alcohol consumption (ideally red wine with the evening meal), but few red and processed meats.

## 2019-09-09 NOTE — Progress Notes (Signed)
Patient referred by Deland Pretty, MD for coronary calcification  Subjective:   Gina [name], female    DOB: [birth year], 58 y.o.   MRN: [id number]   Chief Complaint  Patient presents with  . Coronary Artery Disease  . New Patient (Initial Visit)   HPI  58 y.o. American female with tobacco abuse, coronary atherosclerosis on CT chest.  Patient is a 34 pack-year smoker.  Recently, she underwent lung cancer screening CT chest that showed aortic atherosclerosis and two-vessel atherosclerosis.  Patient works at a cigarette company. She walks up to 15,000 steps a day. She denies chest pain, shortness of breath, palpitations, leg edema, orthopnea, PND, TIA/syncope.She endorses pain in her right calf, more with walking. She does not have any wound, ulcer.   She smokes 1 pack per day. She is trying to wear nicotine patch and intends to quit by end of this year.   Past Medical History:  Diagnosis Date  . Chronic constipation w/ dyssynergic defecation by anorectal manometry 12/13/2011  . Dysphagia, oral phase   . Esophageal reflux   . Family history of malignant neoplasm of gastrointestinal tract   . Hiatal hernia   . Irritable bowel syndrome   . Unspecified vitamin D deficiency      Past Surgical History:  Procedure Laterality Date  . ABDOMINAL HYSTERECTOMY    . ANAL RECTAL MANOMETRY N/A 09/16/2017   Procedure: ANO RECTAL MANOMETRY;  Surgeon: Mauri Pole, MD;  Location: [facility];  Service: Endoscopy;  Laterality: N/A;  . COLONOSCOPY  multiple  . ESOPHAGEAL DILATION    . EYE SURGERY    . ILEOSTOMY    . ILEOSTOMY CLOSURE    . RECTOVAGINAL FISTULA CLOSURE    . SHOULDER SURGERY    . SHOULDER SURGERY    . UPPER GASTROINTESTINAL ENDOSCOPY       Social History   Socioeconomic History  . Marital status: Married    Spouse name: Not on file  . Number of children: 3  . Years of education: Not on file  . Highest education level: Not on file  Occupational  History    Employer: [employer] Needs  . Financial resource strain: Not on file  . Food insecurity    Worry: Not on file    Inability: Not on file  . Transportation needs    Medical: Not on file    Non-medical: Not on file  Tobacco Use  . Smoking status: Current Every Day Smoker    Packs/day: 0.50    Years: 30.00    Pack years: 15.00    Types: Cigarettes  . Smokeless tobacco: Never Used  Substance and Sexual Activity  . Alcohol use: Yes    Comment: occ.  . Drug use: No  . Sexual activity: Yes    Birth control/protection: Surgical  Lifestyle  . Physical activity    Days per week: Not on file    Minutes per session: Not on file  . Stress: Not on file  Relationships  . Social Herbalist on phone: Not on file    Gets together: Not on file    Attends religious service: Not on file    Active member of club or organization: Not on file    Attends meetings of clubs or organizations: Not on file    Relationship status: Not on file  . Intimate partner violence    Fear of current or ex partner: Not on file  Emotionally abused: Not on file    Physically abused: Not on file    Forced sexual activity: Not on file  Other Topics Concern  . Not on file  Social History Narrative   Married works at [employer]    3 children grown   Social alcohol use no substances or tobacco   09/11/2017     Family History  Problem Relation Age of Onset  . Hypertension Mother   . Diabetes Mother   . Coronary artery disease Mother   . Colon cancer Mother        29's     Current Outpatient Medications on File Prior to Visit  Medication Sig Dispense Refill  . AMBULATORY NON FORMULARY MEDICATION Diltiazem gel 2% mixed with Lidocaine 5% Apply a pea size amount to rectum 3 times a day 30 g 2  . Ascorbic Acid (VITAMIN C) 100 MG tablet Take 100 mg by mouth daily.    . fluticasone (FLONASE) 50 MCG/ACT nasal spray Place 2 sprays into the nose as needed. Reported  on 12/20/2015    . ibuprofen (ADVIL,MOTRIN) 600 MG tablet Take 1 tablet (600 mg total) by mouth every 6 (six) hours as needed. 30 tablet 0  . montelukast (SINGULAIR) 10 MG tablet Take 10 mg by mouth at bedtime.    Marland Kitchen PROAIR HFA 108 (90 Base) MCG/ACT inhaler     . VITAMIN D PO Take by mouth daily.     Current Facility-Administered Medications on File Prior to Visit  Medication Dose Route Frequency Provider Last Rate Last Dose  . gadopentetate dimeglumine (MAGNEVIST) injection 20 mL  20 mL Intravenous Once PRN Star Age, MD        Cardiovascular studies:  EKG 09/09/2019: Sinus rhythm 71 bpm. Normal EKG.  CT chest 07/01/2019: 1. Lung-RADS 2S, benign appearance or behavior. Continue annual screening with low-dose chest CT without contrast in 12 months. 2. The "S" modifier above refers to potentially clinically significant non lung cancer related findings. Specifically, there is aortic atherosclerosis, in addition to 2 vessel coronary artery disease. Please note that although the presence of coronary artery calcium documents the presence of coronary artery disease, the severity of this disease and any potential stenosis cannot be assessed on this non-gated CT examination. Assessment for potential risk factor modification, dietary therapy or pharmacologic therapy may be warranted, if clinically indicated. 3. Mild diffuse bronchial wall thickening with mild centrilobular and paraseptal emphysema; imaging findings suggestive of underlying COPD. 4. Old granulomatous disease, as above.  Aortic Atherosclerosis (ICD10-I70.0) and Emphysema (ICD10-J43.9).  Recent labs: 06/15/2019: Glucose 80.  BUN/creatinine 17/0.78.  eGFR normal.  Sodium 143, potassium 5.3.  Rest of the CMP normal. H/H 14/44.  MCV 90.  Platelets 304. Cholesterol 185, TG 49, HDL 51, LDL 124   Review of Systems  Constitution: Negative for decreased appetite, malaise/fatigue, weight gain and weight loss.  HENT: Negative  for congestion.   Eyes: Negative for visual disturbance.  Cardiovascular: Positive for claudication. Negative for chest pain, dyspnea on exertion, leg swelling, palpitations and syncope.  Respiratory: Negative for cough.   Endocrine: Negative for cold intolerance.  Hematologic/Lymphatic: Does not bruise/bleed easily.  Skin: Negative for itching and rash.  Musculoskeletal: Negative for myalgias.  Gastrointestinal: Negative for abdominal pain, nausea and vomiting.  Genitourinary: Negative for dysuria.  Neurological: Negative for dizziness and weakness.  Psychiatric/Behavioral: The patient is not nervous/anxious.   All other systems reviewed and are negative.        Vitals:  09/09/19 0928  BP: 122/77  Pulse: 77  SpO2: 99%     Body mass index is 31.46 kg/m. Filed Weights   09/09/19 0928  Weight: 206 lb 14.4 oz (93.8 kg)     Objective:   Physical Exam  Constitutional: She is oriented to person, place, and time. She appears well-developed and well-nourished. No distress.  HENT:  Head: Normocephalic and atraumatic.  Eyes: Pupils are equal, round, and reactive to light. Conjunctivae are normal.  Neck: No JVD present.  Cardiovascular: Normal rate, regular rhythm and intact distal pulses.  No murmur heard. Pulmonary/Chest: Effort normal and breath sounds normal. She has no wheezes. She has no rales.  Abdominal: Soft. Bowel sounds are normal. There is no rebound.  Musculoskeletal:        General: No edema.  Lymphadenopathy:    She has no cervical adenopathy.  Neurological: She is alert and oriented to person, place, and time. No cranial nerve deficit.  Skin: Skin is warm and dry.  Psychiatric: She has a normal mood and affect.  Nursing note and vitals reviewed.         Assessment & Recommendations:   58 y.o. American female with tobacco abuse, coronary atherosclerosis on CT chest.  Coronary atherosclerosis: Asymptomatic, with good baseline functional capacity.  Recommend medical management with Aspirin 81 mg, start Crestor 20 mg.   Right leg pain: Will obtain baseline ABI.  Tobacco cessation counseling: - Currently smoking 1 packs/day   - Patient was informed of the dangers of tobacco abuse including stroke, cancer, and MI, as well as benefits of tobacco cessation. - Patient is willing to quit at this time. - Approximately 5 mins were spent counseling patient cessation techniques. We discussed various methods to help quit smoking, including deciding on a date to quit, joining a support group, pharmacological agents. Patient would like to use nicotine patch. - I will reassess her progress at the next follow-up visit  Diet & Lifestyle recommendations:  Physical activity recommendation (The Physical Activity Guidelines for Americans. JAMA 2018;Nov 12) At least 150-300 minutes a week of moderate-intensity, or 75-150 minutes a week of vigorous-intensity aerobic physical activity, or an equivalent combination of moderate- and vigorous-intensity aerobic activity. Adults should perform muscle-strengthening activities on 2 or more days a week. Older adults should do multicomponent physical activity that includes balance training as well as aerobic and muscle-strengthening activities. Benefits of increased physical activity include [name] risk of mortality including cardiovascular mortality, [name] risk of cardiovascular events and associated risk factors (hypertension and diabetes), and [name] risk of many cancers (including bladder, breast, colon, endometrium, esophagus, kidney, lung, and stomach). Additional improvments have been seen in cognition, risk of dementia, anxiety and depression, improved bone health, [name] risk of falls, and associated injuries.  Dietary recommendation The 2019 ACC/AHA guidelines promote nutrition as a main fixture of cardiovascular wellness, with a recommendation for a varied diet of fruit, vegetables, fish, legumes, and whole grains  (Class I), as well as recommendations to reduce sodium, cholesterol, processed meats, and refined sugars (Class IIa recommendation).10 Sodium intake, a topic of some controversy as of late, is recommended to be kept at 1,500 mg/day or less, far below the average daily intake in the [place] of 3,409 mg/day, and notably below that of previous [place] recommendations for <2,398m/day.10,11 For those unable to reach 1,500 mg/day, they recommend at least a reduction of 1000 mg/day.  A Pesco-Mediterranean Diet With Intermittent Fasting: JACC Review Topic of the Week. J Am Coll Cardiol 20623;76:2831-5176Pesco-Mediterranean diet,  it is supplemented with extra-virgin olive oil (EVOO), which is the principle fat source, along with moderate amounts of dairy (particularly yogurt and cheese) and eggs, as well as modest amounts of alcohol consumption (ideally red wine with the evening meal), but few red and processed meats.   Thank you for referring the patient to [place]. Please feel free to contact with any questions.  Nigel Mormon, MD [facility] Pager: [phone] Office: [phone]

## 2019-09-10 ENCOUNTER — Ambulatory Visit (INDEPENDENT_AMBULATORY_CARE_PROVIDER_SITE_OTHER): Payer: 59

## 2019-09-10 DIAGNOSIS — M79604 Pain in right leg: Secondary | ICD-10-CM | POA: Diagnosis not present

## 2019-09-14 NOTE — Telephone Encounter (Signed)
S/w pt advised her of results.

## 2019-09-14 NOTE — Telephone Encounter (Signed)
Pt calling for echo results  

## 2019-09-14 NOTE — Telephone Encounter (Signed)
She did not have echocardiogram. She had ABI. Mildly reduced circulation. Continue medical treatment and regular walking.  Thanks MJP

## 2020-01-21 ENCOUNTER — Ambulatory Visit: Payer: Self-pay | Attending: Internal Medicine

## 2020-01-21 DIAGNOSIS — Z23 Encounter for immunization: Secondary | ICD-10-CM

## 2020-01-21 NOTE — Progress Notes (Signed)
   Covid-19 Vaccination Clinic  Name:  Gina [name]    MRN: [id number] DOB: 1961/04/15  01/21/2020  Gina [name] was observed post Covid-19 immunization for 15 minutes without incident. She was provided with Vaccine Information Sheet and instruction to access the V-Safe system.   Gina [name] was instructed to call 911 with any severe reactions post vaccine: Marland Kitchen Difficulty breathing  . Swelling of face and throat  . A fast heartbeat  . A bad rash all over body  . Dizziness and weakness   Immunizations Administered    Name Date Dose VIS Date Route   Pfizer COVID-19 Vaccine 01/21/2020  2:32 PM 0.3 mL 10/02/2019 Intramuscular   Manufacturer: [place]   Lot: [id number]   [place]: [id number]

## 2020-02-15 ENCOUNTER — Ambulatory Visit: Payer: Self-pay | Attending: Internal Medicine

## 2020-02-15 DIAGNOSIS — Z23 Encounter for immunization: Secondary | ICD-10-CM

## 2020-02-15 NOTE — Progress Notes (Signed)
   Covid-19 Vaccination Clinic  Name:  Gina [name]    MRN: [id number] DOB: 08/29/1961  02/15/2020  Gina [name] was observed post Covid-19 immunization for 15 minutes without incident. She was provided with Vaccine Information Sheet and instruction to access the V-Safe system.   Gina [name] was instructed to call 911 with any severe reactions post vaccine: Marland Kitchen Difficulty breathing  . Swelling of face and throat  . A fast heartbeat  . A bad rash all over body  . Dizziness and weakness   Immunizations Administered    Name Date Dose VIS Date Route   Pfizer COVID-19 Vaccine 02/15/2020  4:28 PM 0.3 mL 12/16/2018 Intramuscular   Manufacturer: [place]   Lot: [id number]   Vera: [id number]

## 2020-06-30 ENCOUNTER — Telehealth: Payer: Self-pay

## 2020-06-30 ENCOUNTER — Other Ambulatory Visit: Payer: Self-pay | Admitting: Internal Medicine

## 2020-06-30 DIAGNOSIS — Z1231 Encounter for screening mammogram for malignant neoplasm of breast: Secondary | ICD-10-CM

## 2020-06-30 NOTE — Telephone Encounter (Signed)
Pt called stating she had a physical w/pcp and they wanted her to call us to see if she needed to be seen. Please advise

## 2020-06-30 NOTE — Telephone Encounter (Signed)
As needed

## 2020-07-08 ENCOUNTER — Telehealth: Payer: Self-pay | Admitting: Acute Care

## 2020-07-08 NOTE — Telephone Encounter (Signed)
I have left a message for the patient to return my call to schedule her LCS CT [phone]

## 2020-07-11 NOTE — Telephone Encounter (Signed)
Patient is returning phone call. Patient phone number is [phone].

## 2020-07-11 NOTE — Telephone Encounter (Signed)
I have spoke with Gina [name] and she stated that she had to pay for last years LCS CT that was done last year at [place] [address]. [employer] ordered the CT and scheduled. She stated she wanted to go back to [facility] under Sarah.  Her LCS CT has been scheduled for 07/12/20 @1 :00pm and she is aware of the appt and location

## 2020-07-12 ENCOUNTER — Ambulatory Visit (INDEPENDENT_AMBULATORY_CARE_PROVIDER_SITE_OTHER)
Admission: RE | Admit: 2020-07-12 | Discharge: 2020-07-12 | Disposition: A | Discharge status: Home / Self Care | Payer: 59 | Source: Ambulatory Visit | Attending: Acute Care | Admitting: Acute Care

## 2020-07-12 ENCOUNTER — Other Ambulatory Visit: Payer: Self-pay

## 2020-07-12 ENCOUNTER — Ambulatory Visit
Admission: RE | Admit: 2020-07-12 | Discharge: 2020-07-12 | Disposition: A | Discharge status: Home / Self Care | Payer: 59 | Source: Ambulatory Visit | Attending: Internal Medicine | Admitting: Internal Medicine

## 2020-07-12 DIAGNOSIS — Z87891 Personal history of nicotine dependence: Secondary | ICD-10-CM | POA: Diagnosis not present

## 2020-07-12 DIAGNOSIS — F17210 Nicotine dependence, cigarettes, uncomplicated: Secondary | ICD-10-CM

## 2020-07-12 DIAGNOSIS — Z122 Encounter for screening for malignant neoplasm of respiratory organs: Secondary | ICD-10-CM

## 2020-07-12 DIAGNOSIS — Z1231 Encounter for screening mammogram for malignant neoplasm of breast: Secondary | ICD-10-CM

## 2020-07-14 NOTE — Progress Notes (Signed)

## 2020-07-15 ENCOUNTER — Other Ambulatory Visit: Payer: Self-pay | Admitting: *Deleted

## 2020-07-15 DIAGNOSIS — F17210 Nicotine dependence, cigarettes, uncomplicated: Secondary | ICD-10-CM

## 2020-07-20 NOTE — Telephone Encounter (Signed)
Thank you. I spoke to pt today and she would like for you to review her test results for her CT Lung screen. She is has an appt to go back to her pcp and will follow up with [place] based on her ov visit

## 2020-07-21 NOTE — Telephone Encounter (Signed)
It may be best for the CT scan results to be discussed by the ordering provider, since I do not have expertise in knowing and understanding the details of the findings.  Thanks MJP

## 2020-07-27 ENCOUNTER — Other Ambulatory Visit: Payer: Self-pay | Admitting: Internal Medicine

## 2020-07-27 DIAGNOSIS — F17200 Nicotine dependence, unspecified, uncomplicated: Secondary | ICD-10-CM

## 2020-08-04 DIAGNOSIS — I2510 Atherosclerotic heart disease of native coronary artery without angina pectoris: Secondary | ICD-10-CM | POA: Insufficient documentation

## 2020-08-04 NOTE — Progress Notes (Signed)
Patient referred by Deland Pretty, MD for coronary calcification  Subjective:   Gina [name], female    DOB: 13-Dec-1960, 59 y.o.   MRN: [id number]   Chief Complaint  Patient presents with   Coronary Artery Disease   Follow-up   Results    CT   HPI  59 y.o. American female with coronary atherosclerosis, tobacco dependance  Patient was seen by me in 08/2019 for similar findings of coronary atherosclerosis on lung cancer screening CT chest. At that time, she was walking 15,000 steps/year while at work. Now that she is retired, her walking has gone down. She has noticed retrosternal chest tightness, mostly at rest. She has stable, unchanged mild exertional dyspnea. Unfortunately, she continues to smoke 1 PPD. She is using nicotine patches to try to quit.   Current Outpatient Medications on File Prior to Visit  Medication Sig Dispense Refill   AMBULATORY NON FORMULARY MEDICATION Diltiazem gel 2% mixed with Lidocaine 5% Apply a pea size amount to rectum 3 times a day 30 g 2   Ascorbic Acid (VITAMIN C) 100 MG tablet Take 100 mg by mouth daily.     aspirin EC 81 MG tablet Take 1 tablet (81 mg total) by mouth daily. 90 tablet 3   fluticasone (FLONASE) 50 MCG/ACT nasal spray Place 2 sprays into the nose as needed. Reported on 12/20/2015     ibuprofen (ADVIL,MOTRIN) 600 MG tablet Take 1 tablet (600 mg total) by mouth every 6 (six) hours as needed. 30 tablet 0   montelukast (SINGULAIR) 10 MG tablet Take 10 mg by mouth at bedtime.     PROAIR HFA 108 (90 Base) MCG/ACT inhaler      rosuvastatin (CRESTOR) 20 MG tablet Take 1 tablet (20 mg total) by mouth daily. 90 tablet 3   VITAMIN D PO Take by mouth daily.     Current Facility-Administered Medications on File Prior to Visit  Medication Dose Route Frequency Provider Last Rate Last Admin   gadopentetate dimeglumine (MAGNEVIST) injection 20 mL  20 mL Intravenous Once PRN Star Age, MD        Cardiovascular  studies:  EKG 08/05/2020: Sinus rhythm 80 bpm Borderline left atrial anlargement  CT Chest 07/12/2020: 1. Lung-RADS 2S, benign appearance or behavior. Continue annual screening with low-dose chest CT without contrast in 12 months. 2. The "S" modifier above refers to potentially clinically significant non lung cancer related findings. Specifically, there is aortic atherosclerosis, in addition to 2 vessel coronary artery disease. Please note that although the presence of coronary artery calcium documents the presence of coronary artery disease, the severity of this disease and any potential stenosis cannot be assessed on this non-gated CT examination. Assessment for potential risk factor modification, dietary therapy or pharmacologic therapy may be warranted, if clinically indicated. 3. Mild diffuse bronchial wall thickening with very mild centrilobular and paraseptal emphysema; imaging findings suggestive of underlying COPD.  Aortic Atherosclerosis (ICD10-I70.0) and Emphysema (ICD10-J43.9).  ABI 08/31/2019: This exam reveals normal perfusion of the right [name] extremity (ABI  1.35). This exam reveals normal perfusion of the left [name] extremity (ABI  1.35).  Mildly abnormal biphasic waveforms at the bilateral ankle may suggest mild  diffuse disease. Clinical correlation recommended.  Recent labs: 06/23/2020: Glucose 87, BUN/Cr 16/0.68. EGFR normal. Na/K 140/4.7. Rest of the CMP normal H/H 15/45. MCV 90. Platelets 292 Chol 159, TG 67, HDL 59, LDL 87  06/15/2019: Glucose 80.  BUN/creatinine 17/0.78.  eGFR normal.  Sodium 143, potassium 5.3.  Rest of the CMP normal. H/H 14/44.  MCV 90.  Platelets 304. Cholesterol 185, TG 49, HDL 51, LDL 124   Review of Systems  Cardiovascular: Positive for claudication and leg swelling. Negative for chest pain, dyspnea on exertion, palpitations and syncope.         Vitals:   08/05/20 1349  BP: 130/83  Pulse: 85  Resp: 16  SpO2: 96%      Body mass index is 33.91 kg/m. Filed Weights   08/05/20 1349  Weight: 223 lb (101.2 kg)     Objective:   Physical Exam Vitals and nursing note reviewed.  Constitutional:      General: She is not in acute distress. Neck:     Vascular: No JVD.  Cardiovascular:     Rate and Rhythm: Normal rate and regular rhythm.     Heart sounds: Normal heart sounds. No murmur heard.      Comments: B/l LE varicocities Pulmonary:     Effort: Pulmonary effort is normal.     Breath sounds: Normal breath sounds. No wheezing or rales.         Assessment & Recommendations:   59 y.o. American female with coronary atherosclerosis, tobacco dependance  Coronary atherosclerosis: Mild chest tightness, may be atypical. Will obtain exercise nuclear stress test.  Continue Aspirin 81 mg, increase Crestor to 40 mg.   Leg edema: Likely due to venous insufficiency. Recommend leg elevation, compression stockings.  Tobacco cessation counseling: - Currently smoking 1 packs/day   - Patient was informed of the dangers of tobacco abuse including stroke, cancer, and MI, as well as benefits of tobacco cessation. - Patient is willing to quit at this time. - Approximately 5 mins were spent counseling patient cessation techniques. We discussed various methods to help quit smoking, including deciding on a date to quit, joining a support group, pharmacological agents. Patient would like to use nicotine patch. - I will reassess her progress at the next follow-up visit  F/u in 1 year, unless significant abnormalities found on stress testing.   Nigel Mormon, MD [facility] Pager: [phone] Office: [phone]

## 2020-08-05 ENCOUNTER — Ambulatory Visit: Payer: 59 | Admitting: Cardiology

## 2020-08-05 ENCOUNTER — Encounter: Payer: Self-pay | Admitting: Cardiology

## 2020-08-05 ENCOUNTER — Other Ambulatory Visit: Payer: Self-pay

## 2020-08-05 VITALS — BP 130/83 | HR 85 | Resp 16 | Ht 68.0 in | Wt 223.0 lb

## 2020-08-05 DIAGNOSIS — F17200 Nicotine dependence, unspecified, uncomplicated: Secondary | ICD-10-CM

## 2020-08-05 DIAGNOSIS — I2510 Atherosclerotic heart disease of native coronary artery without angina pectoris: Secondary | ICD-10-CM

## 2020-08-05 DIAGNOSIS — E782 Mixed hyperlipidemia: Secondary | ICD-10-CM | POA: Insufficient documentation

## 2020-08-05 MED ORDER — ROSUVASTATIN CALCIUM 40 MG PO TABS: 40.0000 mg | ORAL_TABLET | Freq: Every day | ORAL | 3 refills | Status: DC

## 2020-08-05 MED ORDER — ROSUVASTATIN CALCIUM 40 MG PO TABS: 20.0000 mg | ORAL_TABLET | Freq: Every day | ORAL | 3 refills | Status: DC

## 2020-08-05 NOTE — Addendum Note (Signed)
Addended by: Nigel Mormon on: 08/05/2020 04:09 PM   Modules accepted: Orders

## 2020-08-15 ENCOUNTER — Other Ambulatory Visit: Payer: Self-pay

## 2020-08-15 ENCOUNTER — Ambulatory Visit: Payer: 59

## 2020-08-15 DIAGNOSIS — I2510 Atherosclerotic heart disease of native coronary artery without angina pectoris: Secondary | ICD-10-CM

## 2020-11-15 ENCOUNTER — Ambulatory Visit
Admission: EM | Admit: 2020-11-15 | Discharge: 2020-11-15 | Disposition: A | Discharge status: Home / Self Care | Payer: 59 | Attending: Emergency Medicine | Admitting: Emergency Medicine

## 2020-11-15 ENCOUNTER — Other Ambulatory Visit: Payer: Self-pay

## 2020-11-15 ENCOUNTER — Encounter: Payer: Self-pay | Admitting: Emergency Medicine

## 2020-11-15 DIAGNOSIS — R195 Other fecal abnormalities: Secondary | ICD-10-CM

## 2020-11-15 DIAGNOSIS — R140 Abdominal distension (gaseous): Secondary | ICD-10-CM | POA: Diagnosis not present

## 2020-11-15 DIAGNOSIS — Z9889 Other specified postprocedural states: Secondary | ICD-10-CM | POA: Diagnosis not present

## 2020-11-15 DIAGNOSIS — R1084 Generalized abdominal pain: Secondary | ICD-10-CM

## 2020-11-15 LAB — POCT URINALYSIS DIP (MANUAL ENTRY)
Bilirubin, UA: NEGATIVE
Blood, UA: NEGATIVE
Glucose, UA: NEGATIVE mg/dL
Ketones, POC UA: NEGATIVE mg/dL
Leukocytes, UA: NEGATIVE
Nitrite, UA: NEGATIVE
Protein Ur, POC: NEGATIVE mg/dL
Spec Grav, UA: >=1.03 — AB (ref 1.010–1.025)
Urobilinogen, UA: 1 E.U./dL
pH, UA: 5.5 (ref 5.0–8.0)

## 2020-11-15 MED ORDER — KETOROLAC TROMETHAMINE 30 MG/ML IJ SOLN
30.0000 mg | Freq: Once | INTRAMUSCULAR | Status: AC
  Administered 2020-11-15: 30 mg via INTRAMUSCULAR

## 2020-11-15 NOTE — Discharge Instructions (Signed)
Please follow clear liquid diet. You were given pain medication via shot today. Do not take any medications to produce bowel movement as this can worsen pain or cause further obstruction if this is underlying cause. Go to ER for worsening pain, decreased stool and flatus, fever, vomiting.

## 2020-11-15 NOTE — ED Triage Notes (Signed)
Pt states that she is having abdominal/back pain in her lower right quadrant. Pt states tat she has not had diarrhea or constipation associated. Pt states that she tried OTC medication to see if it with the bloating but nothing helped.

## 2020-11-15 NOTE — ED Provider Notes (Signed)
[facility]    CSN: [id number] Arrival date & time: 11/15/20  1618      History   Chief Complaint Chief Complaint  Patient presents with  . Abdominal Pain  . Back Pain    HPI Gina [name] is a 60 y.o. female  With extensive medical history as below presenting for 4-day course of right-sided abdominal pain.  States it radiates around to her low back.  Worse with movement.  Associated with abdominal distention, loose stools.  No hematochezia, melena.  Denies chest pain, fever, ShOB.  No change in diet, lifestyle, medications.  Past Medical History:  Diagnosis Date  . Chronic constipation w/ dyssynergic defecation by anorectal manometry 12/13/2011  . Dysphagia, oral phase   . Esophageal reflux   . Family history of malignant neoplasm of gastrointestinal tract   . Hiatal hernia   . Irritable bowel syndrome   . Unspecified vitamin D deficiency     Patient Active Problem List   Diagnosis Date Noted  . Mixed hyperlipidemia 08/05/2020  . Coronary artery disease involving native coronary artery of native heart without angina pectoris 08/04/2020  . Family history of colon cancer 02/08/2017  . Abdominal pain, right lower quadrant 12/17/2011  . Personal history of colonic polyps 12/17/2011  . Family history of colon cancer mother - 54's 12/13/2011  . RLQ abdominal pain 12/13/2011  . Chronic constipation w/ dyssynergic defecation by anorectal manometry 12/13/2011  . H/O resection of large bowel 12/13/2011  . Cervical spondylosis 12/13/2011  . CALLUS, FOOT 03/25/2009  . FOOT PAIN, BILATERAL 03/25/2009  . FIBROIDS, UTERUS 08/25/2008  . Tobacco dependence 08/25/2008  . GERD 08/25/2008  . HIATAL HERNIA 08/25/2008  . IBS 08/25/2008    Past Surgical History:  Procedure Laterality Date  . ABDOMINAL HYSTERECTOMY    . ANAL RECTAL MANOMETRY N/A 09/16/2017   Procedure: ANO RECTAL MANOMETRY;  Surgeon: Mauri Pole, MD;  Location: [facility];  Service:  Endoscopy;  Laterality: N/A;  . COLONOSCOPY  multiple  . ESOPHAGEAL DILATION    . EYE SURGERY    . ILEOSTOMY    . ILEOSTOMY CLOSURE    . RECTOVAGINAL FISTULA CLOSURE    . SHOULDER SURGERY    . SHOULDER SURGERY    . UPPER GASTROINTESTINAL ENDOSCOPY      OB History   No obstetric history on file.      Home Medications    Prior to Admission medications   Medication Sig Start Date End Date Taking? Authorizing Provider  AMBULATORY NON FORMULARY MEDICATION Diltiazem gel 2% mixed with Lidocaine 5% Apply a pea size amount to rectum 3 times a day 09/10/17   Gatha Mayer, MD  Ascorbic Acid (VITAMIN C) 100 MG tablet Take 100 mg by mouth daily.    [provider]  aspirin EC 81 MG tablet Take 1 tablet (81 mg total) by mouth daily. 09/09/19   Patwardhan, Reynold Bowen, MD  fluticasone (FLONASE) 50 MCG/ACT nasal spray Place 2 sprays into the nose as needed. Reported on 12/20/2015    [provider]  ibuprofen (ADVIL,MOTRIN) 600 MG tablet Take 1 tablet (600 mg total) by mouth every 6 (six) hours as needed. 01/27/17   Shary Decamp, PA-C  montelukast (SINGULAIR) 10 MG tablet Take 10 mg by mouth at bedtime.    [provider]  nicotine (NICODERM CQ - DOSED IN MG/24 HOURS) 21 mg/24hr patch 21 mg daily. 07/27/20   [provider]  PROAIR HFA 108 [phone] Base) MCG/ACT inhaler  01/30/17   [provider]  rosuvastatin (CRESTOR) 40 MG tablet Take 1 tablet (40 mg total) by mouth daily. 08/05/20 11/03/20  Patwardhan, Reynold Bowen, MD  VITAMIN D PO Take by mouth daily.    [provider]    Family History Family History  Problem Relation Age of Onset  . Hypertension Mother   . Diabetes Mother   . Coronary artery disease Mother        CABG at age 84  . Colon cancer Mother        66's  . Heart attack Mother   . Heart disease Mother   . Diabetes Sister   . Heart attack Brother   . Diabetes Brother     Social History Social History   Tobacco Use  .  Smoking status: Current Every Day Smoker    Packs/day: 0.50    Years: 30.00    Pack years: 15.00    Types: Cigarettes  . Smokeless tobacco: Never Used  Vaping Use  . Vaping Use: Former  Substance Use Topics  . Alcohol use: Yes    Comment: occ.  . Drug use: No     Allergies   Sulfa antibiotics   Review of Systems Review of Systems  Constitutional: Negative for fatigue and fever.  HENT: Negative for ear pain, sinus pain, sore throat and voice change.   Eyes: Negative for pain, redness and visual disturbance.  Respiratory: Negative for cough and shortness of breath.   Cardiovascular: Negative for chest pain and palpitations.  Gastrointestinal: Positive for abdominal distention, abdominal pain and diarrhea. Negative for blood in stool and vomiting.  Musculoskeletal: Negative for arthralgias and myalgias.  Skin: Negative for rash and wound.  Neurological: Negative for syncope and headaches.     Physical Exam Triage Vital Signs ED Triage Vitals  Enc Vitals Group     BP 11/15/20 1633 136/78     Pulse Rate 11/15/20 1633 79     Resp 11/15/20 1633 17     Temp 11/15/20 1633 97.8 F (36.6 C)     Temp Source 11/15/20 1633 Oral     SpO2 11/15/20 1633 98 %     Weight --      Height --      Head Circumference --      Peak Flow --      Pain Score 11/15/20 1631 10     Pain Loc --      Pain Edu? --      Excl. in [place]? --    No data found.  Updated Vital Signs BP 136/78 (BP Location: Left Arm)   Pulse 79   Temp 97.8 F (36.6 C) (Oral)   Resp 17   SpO2 98%   Visual Acuity Right Eye Distance:   Left Eye Distance:   Bilateral Distance:    Right Eye Near:   Left Eye Near:    Bilateral Near:     Physical Exam Constitutional:      General: She is not in acute distress. HENT:     Head: Normocephalic and atraumatic.  Eyes:     General: No scleral icterus.    Pupils: Pupils are equal, round, and reactive to light.  Cardiovascular:     Rate and Rhythm: Normal rate.   Pulmonary:     Effort: Pulmonary effort is normal.  Abdominal:     General: Bowel sounds are normal.     Palpations: Abdomen is soft. There is no hepatomegaly or splenomegaly.  Tenderness: There is abdominal tenderness in the right lower quadrant. There is no right CVA tenderness, left CVA tenderness or guarding. Negative signs include Murphy's sign, Rovsing's sign and McBurney's sign.  Skin:    Coloration: Skin is not jaundiced or pale.  Neurological:     Mental Status: She is alert and oriented to person, place, and time.      UC Treatments / Results  Labs (all labs ordered are listed, but only abnormal results are displayed) Labs Reviewed  POCT URINALYSIS DIP (MANUAL ENTRY) - Abnormal; Notable for the following components:      Result Value   Spec Grav, UA >=1.030 (*)    All other components within normal limits    EKG   Radiology   Procedures Procedures (including critical care time)  Medications Ordered in UC Medications  ketorolac (TORADOL) 30 MG/ML injection 30 mg (30 mg Intramuscular Given 11/15/20 1814)    Initial Impression / Assessment and Plan / UC Course  I have reviewed the triage vital signs and the nursing notes.  Pertinent labs & imaging results that were available during my care of the patient were reviewed by me and considered in my medical decision making (see chart for details).     Patient febrile, nontoxic in office today.  Given Toradol in office which she tolerated well.  Patient feels pain is deep, the more concerning is her medical history.  Does have history of abdominal pelvic surgery (hysterectomy) that was complicated by rectovaginal fistula as complication, as well as subsequent temporary ileostomy and reversal.  Given patient's concern for possible intra-abdominal process, medical history, recommended ER for further evaluation.  Electing to transport in stable condition and personal vehicle.  Return precautions discussed, pt verbalized  understanding and is agreeable to plan. Final Clinical Impressions(s) / UC Diagnoses   Final diagnoses:  Abdominal pain, generalized  Abdominal distension  Loose stools  History of major abdominal surgery     Discharge Instructions     Please follow clear liquid diet. You were given pain medication via shot today. Do not take any medications to produce bowel movement as this can worsen pain or cause further obstruction if this is underlying cause. Go to ER for worsening pain, decreased stool and flatus, fever, vomiting.    ED Prescriptions    None     PDMP not reviewed this encounter.   [place], [place], [place] 11/18/20 1556

## 2020-11-16 ENCOUNTER — Emergency Department (HOSPITAL_BASED_OUTPATIENT_CLINIC_OR_DEPARTMENT_OTHER)
Admission: EM | Admit: 2020-11-16 | Discharge: 2020-11-16 | Disposition: A | Discharge status: Home / Self Care | Payer: 59 | Attending: Emergency Medicine | Admitting: Emergency Medicine

## 2020-11-16 ENCOUNTER — Other Ambulatory Visit: Payer: Self-pay

## 2020-11-16 ENCOUNTER — Encounter (HOSPITAL_BASED_OUTPATIENT_CLINIC_OR_DEPARTMENT_OTHER): Payer: Self-pay | Admitting: *Deleted

## 2020-11-16 ENCOUNTER — Emergency Department (HOSPITAL_BASED_OUTPATIENT_CLINIC_OR_DEPARTMENT_OTHER): Payer: 59

## 2020-11-16 DIAGNOSIS — I2510 Atherosclerotic heart disease of native coronary artery without angina pectoris: Secondary | ICD-10-CM | POA: Insufficient documentation

## 2020-11-16 DIAGNOSIS — R109 Unspecified abdominal pain: Secondary | ICD-10-CM | POA: Diagnosis not present

## 2020-11-16 DIAGNOSIS — Z90711 Acquired absence of uterus with remaining cervical stump: Secondary | ICD-10-CM | POA: Diagnosis not present

## 2020-11-16 DIAGNOSIS — F17210 Nicotine dependence, cigarettes, uncomplicated: Secondary | ICD-10-CM | POA: Diagnosis not present

## 2020-11-16 DIAGNOSIS — Z7982 Long term (current) use of aspirin: Secondary | ICD-10-CM | POA: Insufficient documentation

## 2020-11-16 DIAGNOSIS — M62830 Muscle spasm of back: Secondary | ICD-10-CM | POA: Diagnosis not present

## 2020-11-16 DIAGNOSIS — Z7952 Long term (current) use of systemic steroids: Secondary | ICD-10-CM | POA: Diagnosis not present

## 2020-11-16 DIAGNOSIS — K5900 Constipation, unspecified: Secondary | ICD-10-CM | POA: Insufficient documentation

## 2020-11-16 LAB — URINALYSIS, ROUTINE W REFLEX MICROSCOPIC
Bilirubin Urine: NEGATIVE
Glucose, UA: NEGATIVE mg/dL
Hgb urine dipstick: NEGATIVE
Ketones, ur: NEGATIVE mg/dL
Leukocytes,Ua: NEGATIVE
Nitrite: NEGATIVE
Protein, ur: NEGATIVE mg/dL
Specific Gravity, Urine: 1.01 (ref 1.005–1.030)
pH: 6.5 (ref 5.0–8.0)

## 2020-11-16 LAB — CBC WITH DIFFERENTIAL/PLATELET
Abs Immature Granulocytes: 0.01 10*3/uL (ref 0.00–0.07)
Basophils Absolute: 0 10*3/uL (ref 0.0–0.1)
Basophils Relative: 0 %
Eosinophils Absolute: 0.1 10*3/uL (ref 0.0–0.5)
Eosinophils Relative: 1 %
HCT: 43.3 % (ref 36.0–46.0)
Hemoglobin: 14.4 g/dL (ref 12.0–15.0)
Immature Granulocytes: 0 %
Lymphocytes Relative: 51 %
Lymphs Abs: 4.3 10*3/uL — ABNORMAL HIGH (ref 0.7–4.0)
MCH: 31.2 pg (ref 26.0–34.0)
MCHC: 33.3 g/dL (ref 30.0–36.0)
MCV: 93.7 fL (ref 80.0–100.0)
Monocytes Absolute: 0.5 10*3/uL (ref 0.1–1.0)
Monocytes Relative: 6 %
Neutro Abs: 3.6 10*3/uL (ref 1.7–7.7)
Neutrophils Relative %: 42 %
Platelets: 257 10*3/uL (ref 150–400)
RBC: 4.62 MIL/uL (ref 3.87–5.11)
RDW: 14.6 % (ref 11.5–15.5)
WBC: 8.5 10*3/uL (ref 4.0–10.5)
nRBC: 0 % (ref 0.0–0.2)

## 2020-11-16 LAB — COMPREHENSIVE METABOLIC PANEL
ALT: 19 U/L (ref 0–44)
AST: 20 U/L (ref 15–41)
Albumin: 4 g/dL (ref 3.5–5.0)
Alkaline Phosphatase: 62 U/L (ref 38–126)
Anion gap: 9 (ref 5–15)
BUN: 13 mg/dL (ref 6–20)
CO2: 25 mmol/L (ref 22–32)
Calcium: 9 mg/dL (ref 8.9–10.3)
Chloride: 104 mmol/L (ref 98–111)
Creatinine, Ser: 0.66 mg/dL (ref 0.44–1.00)
GFR, Estimated: >60 mL/min (ref >60)
Glucose, Bld: 122 mg/dL — ABNORMAL HIGH (ref 70–99)
Potassium: 3.4 mmol/L — ABNORMAL LOW (ref 3.5–5.1)
Sodium: 138 mmol/L (ref 135–145)
Total Bilirubin: 0.4 mg/dL (ref 0.3–1.2)
Total Protein: 7.5 g/dL (ref 6.5–8.1)

## 2020-11-16 LAB — LIPASE, BLOOD: Lipase: 25 U/L (ref 11–51)

## 2020-11-16 MED ORDER — IOHEXOL 300 MG/ML  SOLN
100.0000 mL | Freq: Once | INTRAMUSCULAR | Status: AC | PRN
  Administered 2020-11-16: 100 mL via INTRAVENOUS

## 2020-11-16 NOTE — ED Triage Notes (Signed)
C/o right lower abd pain which radiates to x 4 days , seen by UC x 1 day ago  right lower back pan

## 2020-11-16 NOTE — ED Provider Notes (Signed)
[facility] EMERGENCY DEPARTMENT Provider Note   CSN: [id number] Arrival date & time: 11/16/20  1452     History Chief Complaint  Patient presents with  . Abdominal Pain    Gina [name] is a 60 y.o. female past medical history of chronic constipation, abdominal hysterectomy, IBS, presenting to the emergency department with complaint of intermittent right-sided abdominal pain radiated around her flank and her back x4 days.  Pain is worse with movement.  She states she has intermittent waves of pain that feel like a muscle soreness or cramp.  She has chronic constipation that is not any different than usual.  She is having bowel movements.  No nausea, vomiting, diarrhea, fevers, urinary symptoms.  She went to urgent care yesterday who did a urine dipstick and treated her with Toradol.  She states the Toradol provided relief overnight, however pain returned. 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  This was many years ago.  No complications since then.  Was followed by Dr. Carlean Purl with a [facility] for her chronic constipation, has not seen him in a couple of years.  The history is provided by the patient.       Past Medical History:  Diagnosis Date  . Chronic constipation w/ dyssynergic defecation by anorectal manometry 12/13/2011  . Dysphagia, oral phase   . Esophageal reflux   . Family history of malignant neoplasm of gastrointestinal tract   . Hiatal hernia   . Irritable bowel syndrome   . Unspecified vitamin D deficiency     Patient Active Problem List   Diagnosis Date Noted  . Mixed hyperlipidemia 08/05/2020  . Coronary artery disease involving native coronary artery of native heart without angina pectoris 08/04/2020  . Family history of colon cancer 02/08/2017  . Abdominal pain, right lower quadrant 12/17/2011  . Personal history of colonic  polyps 12/17/2011  . Family history of colon cancer mother - 67's 12/13/2011  . RLQ abdominal pain 12/13/2011  . Chronic constipation w/ dyssynergic defecation by anorectal manometry 12/13/2011  . H/O resection of large bowel 12/13/2011  . Cervical spondylosis 12/13/2011  . CALLUS, FOOT 03/25/2009  . FOOT PAIN, BILATERAL 03/25/2009  . FIBROIDS, UTERUS 08/25/2008  . Tobacco dependence 08/25/2008  . GERD 08/25/2008  . HIATAL HERNIA 08/25/2008  . IBS 08/25/2008    Past Surgical History:  Procedure Laterality Date  . ABDOMINAL HYSTERECTOMY    . ANAL RECTAL MANOMETRY N/A 09/16/2017   Procedure: ANO RECTAL MANOMETRY;  Surgeon: Mauri Pole, MD;  Location: [facility];  Service: Endoscopy;  Laterality: N/A;  . COLONOSCOPY  multiple  . ESOPHAGEAL DILATION    . EYE SURGERY    . ILEOSTOMY    . ILEOSTOMY CLOSURE    . RECTOVAGINAL FISTULA CLOSURE    . SHOULDER SURGERY    . SHOULDER SURGERY    . UPPER GASTROINTESTINAL ENDOSCOPY       OB History   No obstetric history on file.     Family History  Problem Relation Age of Onset  . Hypertension Mother   . Diabetes Mother   . Coronary artery disease Mother        CABG at age 61  . Colon cancer Mother        90's  . Heart attack Mother   . Heart disease Mother   . Diabetes Sister   . Heart attack Brother   .  Diabetes Brother     Social History   Tobacco Use  . Smoking status: Current Every Day Smoker    Packs/day: 0.50    Years: 30.00    Pack years: 15.00    Types: Cigarettes  . Smokeless tobacco: Never Used  Vaping Use  . Vaping Use: Former  Substance Use Topics  . Alcohol use: Yes    Comment: occ.  . Drug use: No    Home Medications Prior to Admission medications   Medication Sig Start Date End Date Taking? Authorizing Provider  AMBULATORY NON FORMULARY MEDICATION Diltiazem gel 2% mixed with Lidocaine 5% Apply a pea size amount to rectum 3 times a day 09/10/17   Gatha Mayer, MD  Ascorbic Acid  (VITAMIN C) 100 MG tablet Take 100 mg by mouth daily.    [provider]  aspirin EC 81 MG tablet Take 1 tablet (81 mg total) by mouth daily. 09/09/19   Patwardhan, Reynold Bowen, MD  fluticasone (FLONASE) 50 MCG/ACT nasal spray Place 2 sprays into the nose as needed. Reported on 12/20/2015    [provider]  ibuprofen (ADVIL,MOTRIN) 600 MG tablet Take 1 tablet (600 mg total) by mouth every 6 (six) hours as needed. 01/27/17   Shary Decamp, PA-C  montelukast (SINGULAIR) 10 MG tablet Take 10 mg by mouth at bedtime.    [provider]  nicotine (NICODERM CQ - DOSED IN MG/24 HOURS) 21 mg/24hr patch 21 mg daily. 07/27/20   [provider]  PROAIR HFA 108 [phone] Base) MCG/ACT inhaler  01/30/17   [provider]  rosuvastatin (CRESTOR) 40 MG tablet Take 1 tablet (40 mg total) by mouth daily. 08/05/20 11/03/20  Patwardhan, Reynold Bowen, MD  VITAMIN D PO Take by mouth daily.    [provider]    Allergies    Sulfa antibiotics  Review of Systems   Review of Systems  Gastrointestinal: Positive for abdominal pain.  All other systems reviewed and are negative.   Physical Exam Updated Vital Signs BP (!) 169/79 (BP Location: Left Arm)   Pulse 80   Temp 97.9 F (36.6 C) (Oral)   Resp 18   Ht 5\' 9"  (1.753 m)   Wt 102.1 kg   SpO2 99%   BMI 33.23 kg/m   Physical Exam Vitals and nursing note reviewed.  Constitutional:      General: She is not in acute distress.    Appearance: She is well-developed and well-nourished. She is not ill-appearing.  HENT:     Head: Normocephalic and atraumatic.  Eyes:     Conjunctiva/sclera: Conjunctivae normal.  Cardiovascular:     Rate and Rhythm: Normal rate and regular rhythm.  Pulmonary:     Effort: Pulmonary effort is normal. No respiratory distress.     Breath sounds: Normal breath sounds.  Abdominal:     General: Abdomen is flat. A surgical scar is present. Bowel sounds are normal.     Palpations: Abdomen is soft.      Tenderness: There is no abdominal tenderness. There is no guarding or rebound.  Musculoskeletal:     Comments: TTP to left lumbar paraspinal musculature  Skin:    General: Skin is warm.  Neurological:     Mental Status: She is alert.  Psychiatric:        Mood and Affect: Mood and affect normal.        Behavior: Behavior normal.     ED Results / Procedures / Treatments   Labs (all  labs ordered are listed, but only abnormal results are displayed) Labs Reviewed  CBC WITH DIFFERENTIAL/PLATELET - Abnormal; Notable for the following components:      Result Value   Lymphs Abs 4.3 (*)    All other components within normal limits  COMPREHENSIVE METABOLIC PANEL - Abnormal; Notable for the following components:   Potassium 3.4 (*)    Glucose, Bld 122 (*)    All other components within normal limits  LIPASE, BLOOD  URINALYSIS, ROUTINE W REFLEX MICROSCOPIC    EKG None  Radiology CT ABDOMEN PELVIS W CONTRAST  Result Date: 11/16/2020 CLINICAL DATA:  59 year old female with RIGHT abdominal and pelvic pain for 3 days. EXAM: CT ABDOMEN AND PELVIS WITH CONTRAST TECHNIQUE: Multidetector CT imaging of the abdomen and pelvis was performed using the standard protocol following bolus administration of intravenous contrast. CONTRAST:  18mL OMNIPAQUE IOHEXOL 300 MG/ML  SOLN COMPARISON:  11/06/2013 CT FINDINGS: Lower chest: No acute abnormality Hepatobiliary: No significant hepatic or gallbladder abnormalities noted. No biliary dilatation. Pancreas: Unremarkable Spleen: Calcified granulomas within the spleen are noted. Adrenals/Urinary Tract: The kidneys, adrenal glands and bladder are unremarkable. Stomach/Bowel: Stomach is within normal limits. Appendix appears normal. No evidence of bowel wall thickening, distention, or inflammatory changes. Surgical changes in the distal colon/rectum noted. Vascular/Lymphatic: Calcified retroperitoneal and UPPER abdominal lymph nodes are again noted. Aortic  atherosclerotic calcifications noted without abdominal aortic aneurysm. Reproductive: Status post hysterectomy. No adnexal masses. Other: No ascites, focal collection or pneumoperitoneum. Musculoskeletal: No acute or significant osseous findings. IMPRESSION: 1. No evidence of acute abnormality. 2. Aortic Atherosclerosis (ICD10-I70.0). Electronically Signed   By: Margarette Canada M.D.   On: 11/16/2020 18:42    Procedures Procedures   Medications Ordered in ED Medications  iohexol (OMNIPAQUE) 300 MG/ML solution 100 mL (100 mLs Intravenous Contrast Given 11/16/20 1821)    ED Course  I have reviewed the triage vital signs and the nursing notes.  Pertinent labs & imaging results that were available during my care of the patient were reviewed by me and considered in my medical decision making (see chart for details).    MDM Rules/Calculators/A&P                          Patient presenting for 4 days of right-sided abdominal pain radiating around to her low back.  It is worse with movement.  She states it feels like a muscle soreness or cramp.  She does have abdominal surgery history of rectovaginal fistula as a complication after hysterectomy which was treated and had temporary ileostomy with reversal.  On exam she is well-appearing no distress.  Afebrile stable vital signs.  Abdomen is soft with some generalized tenderness to the right lower back though no tenderness to the abdomen on exam.  Labs without leukocytosis or significant electrolyte derangement.  Normal renal and hepatic function.  Normal lipase.  CT abdomen pelvis is negative for acute findings.  UA is negative.   Work-up today does not reveal any evidence of surgical or emergent intra-abdominal cause of patient's pain.  Recommend symptomatic management, oral hydration, PCP follow-up if symptoms persist.  Strict return precautions.   Final Clinical Impression(s) / ED Diagnoses Final diagnoses:  Right sided abdominal pain    Rx / DC  Orders ED Discharge Orders    None       Chloee Tena, [place] N, PA-C 11/16/20 1959    Lucrezia Starch, MD 11/19/20 0002

## 2020-11-16 NOTE — Discharge Instructions (Addendum)
Your urine looks normal. Please follow with your primary care provider. You can treat your symptoms with over-the-counter medications. Return to the ED with severely worsening abdominal pain, fever, uncontrollable vomiting, or if you stop passing gas and having bowel movements.

## 2020-12-05 ENCOUNTER — Ambulatory Visit (INDEPENDENT_AMBULATORY_CARE_PROVIDER_SITE_OTHER): Payer: 59

## 2020-12-05 ENCOUNTER — Ambulatory Visit
Admission: EM | Admit: 2020-12-05 | Discharge: 2020-12-05 | Disposition: A | Discharge status: Home / Self Care | Payer: 59 | Attending: Internal Medicine | Admitting: Internal Medicine

## 2020-12-05 ENCOUNTER — Other Ambulatory Visit: Payer: Self-pay

## 2020-12-05 ENCOUNTER — Encounter: Payer: Self-pay | Admitting: Physician Assistant

## 2020-12-05 DIAGNOSIS — R0989 Other specified symptoms and signs involving the circulatory and respiratory systems: Secondary | ICD-10-CM | POA: Diagnosis not present

## 2020-12-05 DIAGNOSIS — R079 Chest pain, unspecified: Secondary | ICD-10-CM

## 2020-12-05 MED ORDER — OMEPRAZOLE 20 MG PO CPDR: 20.0000 mg | DELAYED_RELEASE_CAPSULE | Freq: Every day | ORAL | 1 refills | Status: DC

## 2020-12-05 NOTE — ED Provider Notes (Signed)
[facility]    CSN: [id number] Arrival date & time: 12/05/20  8032      History   Chief Complaint Chief Complaint  Patient presents with  . Sore Throat    Since thursday    HPI Gina [name] is a 60 y.o. female.   The history is provided by the patient. No language interpreter was used.  Sore Throat This is a new problem. Episode onset: 5 days. The problem occurs constantly. The problem has not changed since onset.Pertinent negatives include no chest pain and no abdominal pain. Nothing aggravates the symptoms. Nothing relieves the symptoms. She has tried nothing for the symptoms. The treatment provided no relief.  Pt complains of the sensation of having something stuck in her throat.  Pt reports she is able to eat and drink without difficulty   Past Medical History:  Diagnosis Date  . Chronic constipation w/ dyssynergic defecation by anorectal manometry 12/13/2011  . Dysphagia, oral phase   . Esophageal reflux   . Family history of malignant neoplasm of gastrointestinal tract   . Hiatal hernia   . Irritable bowel syndrome   . Unspecified vitamin D deficiency     Patient Active Problem List   Diagnosis Date Noted  . Mixed hyperlipidemia 08/05/2020  . Coronary artery disease involving native coronary artery of native heart without angina pectoris 08/04/2020  . Family history of colon cancer 02/08/2017  . Abdominal pain, right lower quadrant 12/17/2011  . Personal history of colonic polyps 12/17/2011  . Family history of colon cancer mother - 54's 12/13/2011  . RLQ abdominal pain 12/13/2011  . Chronic constipation w/ dyssynergic defecation by anorectal manometry 12/13/2011  . H/O resection of large bowel 12/13/2011  . Cervical spondylosis 12/13/2011  . CALLUS, FOOT 03/25/2009  . FOOT PAIN, BILATERAL 03/25/2009  . FIBROIDS, UTERUS 08/25/2008  . Tobacco dependence 08/25/2008  . GERD 08/25/2008  . HIATAL HERNIA 08/25/2008  . IBS 08/25/2008    Past  Surgical History:  Procedure Laterality Date  . ABDOMINAL HYSTERECTOMY    . ANAL RECTAL MANOMETRY N/A 09/16/2017   Procedure: ANO RECTAL MANOMETRY;  Surgeon: Mauri Pole, MD;  Location: [facility];  Service: Endoscopy;  Laterality: N/A;  . COLONOSCOPY  multiple  . ESOPHAGEAL DILATION    . EYE SURGERY    . ILEOSTOMY    . ILEOSTOMY CLOSURE    . RECTOVAGINAL FISTULA CLOSURE    . SHOULDER SURGERY    . SHOULDER SURGERY    . UPPER GASTROINTESTINAL ENDOSCOPY      OB History   No obstetric history on file.      Home Medications    Prior to Admission medications   Medication Sig Start Date End Date Taking? Authorizing Provider  AMBULATORY NON FORMULARY MEDICATION Diltiazem gel 2% mixed with Lidocaine 5% Apply a pea size amount to rectum 3 times a day 09/10/17  Yes Gatha Mayer, MD  Ascorbic Acid (VITAMIN C) 100 MG tablet Take 100 mg by mouth daily.   Yes [provider]  aspirin EC 81 MG tablet Take 1 tablet (81 mg total) by mouth daily. 09/09/19  Yes Patwardhan, Manish J, MD  ibuprofen (ADVIL,MOTRIN) 600 MG tablet Take 1 tablet (600 mg total) by mouth every 6 (six) hours as needed. 01/27/17  Yes Shary Decamp, PA-C  montelukast (SINGULAIR) 10 MG tablet Take 10 mg by mouth at bedtime.   Yes [provider]  omeprazole (PRILOSEC) 20 MG capsule Take 1 capsule (20 mg total)  by mouth daily. 12/05/20 12/05/21 Yes Sidney Ace  [facility] HFA 108 [phone]) MCG/ACT inhaler  01/30/17  Yes [provider]  VITAMIN D PO Take by mouth daily.   Yes [provider]  fluticasone (FLONASE) 50 MCG/ACT nasal spray Place 2 sprays into the nose as needed. Reported on 12/20/2015    [provider]  nicotine (NICODERM CQ - DOSED IN MG/24 HOURS) 21 mg/24hr patch 21 mg daily. 07/27/20   [provider]  rosuvastatin (CRESTOR) 40 MG tablet Take 1 tablet (40 mg total) by mouth daily. 08/05/20 11/03/20  Nigel Mormon, MD    Family  History Family History  Problem Relation Age of Onset  . Hypertension Mother   . Diabetes Mother   . Coronary artery disease Mother        CABG at age 55  . Colon cancer Mother        63's  . Heart attack Mother   . Heart disease Mother   . Diabetes Sister   . Heart attack Brother   . Diabetes Brother     Social History Social History   Tobacco Use  . Smoking status: Current Every Day Smoker    Packs/day: 0.50    Years: 30.00    Pack years: 15.00    Types: Cigarettes  . Smokeless tobacco: Never Used  Vaping Use  . Vaping Use: Former  Substance Use Topics  . Alcohol use: Yes    Comment: occ.  . Drug use: No     Allergies   Sulfa antibiotics   Review of Systems Review of Systems  Cardiovascular: Negative for chest pain.  Gastrointestinal: Negative for abdominal pain.  All other systems reviewed and are negative.    Physical Exam Triage Vital Signs ED Triage Vitals  Enc Vitals Group     BP 12/05/20 0954 119/86     Pulse Rate 12/05/20 0954 88     Resp 12/05/20 0954 18     Temp 12/05/20 0954 98.2 F (36.8 C)     Temp Source 12/05/20 0954 Oral     SpO2 12/05/20 0954 99 %     Weight --      Height --      Head Circumference --      Peak Flow --      Pain Score 12/05/20 1101 0     Pain Loc --      Pain Edu? --      Excl. in [place]? --    No data found.  Updated Vital Signs BP 119/86 (BP Location: Left Arm)   Pulse 88   Temp 98.2 F (36.8 C) (Oral)   Resp 18   SpO2 97%   Visual Acuity Right Eye Distance:   Left Eye Distance:   Bilateral Distance:    Right Eye Near:   Left Eye Near:    Bilateral Near:     Physical Exam Vitals and nursing note reviewed.  Constitutional:      Appearance: She is well-developed and well-nourished.  HENT:     Head: Normocephalic.     Mouth/Throat:     Mouth: Mucous membranes are moist.     Tonsils: No tonsillar exudate.  Eyes:     Extraocular Movements: EOM normal.  Cardiovascular:     Rate and Rhythm:  Normal rate.  Pulmonary:     Effort: Pulmonary effort is normal.  Abdominal:     General: There is no distension.  Musculoskeletal:  General: Normal range of motion.     Cervical back: Normal range of motion.  Neurological:     General: No focal deficit present.     Mental Status: She is alert and oriented to person, place, and time.  Psychiatric:        Mood and Affect: Mood and affect and mood normal.      UC Treatments / Results  Labs (all labs ordered are listed, but only abnormal results are displayed) Labs Reviewed - No data to display  EKG   Radiology DG Chest 2 View  Result Date: 12/05/2020 CLINICAL DATA:  Chest pain. EXAM: CHEST - 2 VIEW COMPARISON:  April 13, 2014. FINDINGS: The heart size and mediastinal contours are within normal limits. Both lungs are clear. No pneumothorax or pleural effusion is noted. The visualized skeletal structures are unremarkable. IMPRESSION: No active cardiopulmonary disease. Electronically Signed   By: Marijo Conception M.D.   On: 12/05/2020 10:21    Procedures Procedures (including critical care time)  Medications Ordered in UC Medications - No data to display  Initial Impression / Assessment and Plan / UC Course  I have reviewed the triage vital signs and the nursing notes.  Pertinent labs & imaging results that were available during my care of the patient were reviewed by me and considered in my medical decision making (see chart for details).     I suspect reflux or esphagitis.  Pt advised to schedule to see Gi for evaltuion.  I will try pt on prilosec.  Final Clinical Impressions(s) / UC Diagnoses   Final diagnoses:  Throat fullness   Discharge Instructions   None    ED Prescriptions    Medication Sig Dispense Auth. Provider   omeprazole (PRILOSEC) 20 MG capsule Take 1 capsule (20 mg total) by mouth daily. 30 capsule Fransico Meadow, [place]     PDMP not reviewed this encounter.  An After Visit Summary was  printed and given to the patient.   Fransico Meadow, [place] 12/05/20 1139

## 2020-12-05 NOTE — ED Triage Notes (Signed)
Patient states she has felt like there is a lump in her throat since Thursday. Pt states it has not improved or worsened. Pt states she has developed a cough but feel she is trying to expel the lump. Pt is aox4 and ambulatory.

## 2020-12-20 ENCOUNTER — Encounter: Payer: Self-pay | Admitting: Physician Assistant

## 2020-12-20 ENCOUNTER — Ambulatory Visit: Payer: 59 | Admitting: Physician Assistant

## 2020-12-20 VITALS — BP 116/80 | HR 80 | Ht 69.0 in | Wt 224.8 lb

## 2020-12-20 DIAGNOSIS — R198 Other specified symptoms and signs involving the digestive system and abdomen: Secondary | ICD-10-CM

## 2020-12-20 DIAGNOSIS — Z800 Family history of malignant neoplasm of digestive organs: Secondary | ICD-10-CM | POA: Diagnosis not present

## 2020-12-20 DIAGNOSIS — R0989 Other specified symptoms and signs involving the circulatory and respiratory systems: Secondary | ICD-10-CM

## 2020-12-20 NOTE — Patient Instructions (Signed)
If you are age 61 or older, your body mass index should be between 23-30. Your Body mass index is 33.2 kg/m. If this is out of the aforementioned range listed, please consider follow up with your Primary Care Provider.  If you are age 39 or younger, your body mass index should be between 19-25. Your Body mass index is 33.2 kg/m. If this is out of the aformentioned range listed, please consider follow up with your Primary Care Provider.   You have been scheduled for an endoscopy. Please follow written instructions given to you at your visit today. If you use inhalers (even only as needed), please bring them with you on the day of your procedure.  Stop omeprazole   Thank you for choosing me and [facility].  Ellouise Newer, PA-C

## 2020-12-20 NOTE — Progress Notes (Signed)
Chief Complaint: "Throat issues"  HPI:    Gina [name] is a 60 year old African-American female with a past medical history of reflux and dysphagia, known to Dr. Carlean Purl, who was referred to me by Deland Pretty, MD for a complaint of "throat issues".      12/17/2011 EGD with a hiatal hernia and empiric dilation.    03/06/2017 colonoscopy for family history of colon cancer with 4 1-3 mm polyps in the sigmoid colon and ascending colon as well as the transverse colon.  Rectovaginal fistula repair changes otherwise normal.  Pathology showed hyperplastic polyps.  Repeat recommended in 5 years    09/10/2017 patient seen in clinic by Dr. Carlean Purl for constipation and gas.  At that time MiraLAX was stopped and patient was continued on Benefiber alone.  Also checked a lactulose hydrogen breath test.    09/16/2017 anal manometry showed abnormal pelvic floor.  She was sent to pelvic PT.    11/16/2020 CT abdomen pelvis with contrast showed no evidence of acute abnormality.  Aortic atherosclerosis.    12/05/2020 patient seen in the urgent care for "throat fullness".  Chest x-ray was normal.  It was discussed this is likely reflux/esophagitis.  Patient was started on omeprazole 20 mg daily.    Today, the patient tells me that for the past month or so she has been having issues with feeling like there is a constant "lump in my throat".  Tells me that she cannot get it out no matter what she drinks or eats.  Does describe that she is not actually having anything get stuck on the way down like "before", but is constantly bothered by this sensation.  Tells me that sometimes she feels like she "cannot get a burp out".  She has been on Omeprazole 20 mg daily over the past month and this has made no change in symptoms.  She asked to stop this medication today.    Denies fever, chills, GERD, weight loss, nausea or vomiting.     Past Medical History:  Diagnosis Date  . Chronic constipation w/ dyssynergic defecation by  anorectal manometry 12/13/2011  . Dysphagia, oral phase   . Esophageal reflux   . Family history of malignant neoplasm of gastrointestinal tract   . Hiatal hernia   . Irritable bowel syndrome   . Unspecified vitamin D deficiency     Past Surgical History:  Procedure Laterality Date  . ABDOMINAL HYSTERECTOMY    . ANAL RECTAL MANOMETRY N/A 09/16/2017   Procedure: ANO RECTAL MANOMETRY;  Surgeon: Mauri Pole, MD;  Location: [facility];  Service: Endoscopy;  Laterality: N/A;  . COLONOSCOPY  multiple  . ESOPHAGEAL DILATION    . EYE SURGERY    . ILEOSTOMY    . ILEOSTOMY CLOSURE    . RECTOVAGINAL FISTULA CLOSURE    . SHOULDER SURGERY    . SHOULDER SURGERY    . UPPER GASTROINTESTINAL ENDOSCOPY      Current Outpatient Medications  Medication Sig Dispense Refill  . AMBULATORY NON FORMULARY MEDICATION Diltiazem gel 2% mixed with Lidocaine 5% Apply a pea size amount to rectum 3 times a day 30 g 2  . Ascorbic Acid (VITAMIN C) 100 MG tablet Take 100 mg by mouth daily.    Marland Kitchen aspirin EC 81 MG tablet Take 1 tablet (81 mg total) by mouth daily. 90 tablet 3  . fluticasone (FLONASE) 50 MCG/ACT nasal spray Place 2 sprays into the nose as needed. Reported on 12/20/2015    . ibuprofen (ADVIL,MOTRIN)  600 MG tablet Take 1 tablet (600 mg total) by mouth every 6 (six) hours as needed. 30 tablet 0  . montelukast (SINGULAIR) 10 MG tablet Take 10 mg by mouth at bedtime.    . nicotine (NICODERM CQ - DOSED IN MG/24 HOURS) 21 mg/24hr patch 21 mg daily.    Marland Kitchen omeprazole (PRILOSEC) 20 MG capsule Take 1 capsule (20 mg total) by mouth daily. 30 capsule 1  . PROAIR HFA 108 (90 Base) MCG/ACT inhaler     . rosuvastatin (CRESTOR) 40 MG tablet Take 1 tablet (40 mg total) by mouth daily. 90 tablet 3  . VITAMIN D PO Take by mouth daily.     No current facility-administered medications for this visit.   Facility-Administered Medications Ordered in Other Visits  Medication Dose Route Frequency Provider Last Rate  Last Admin  . gadopentetate dimeglumine (MAGNEVIST) injection 20 mL  20 mL Intravenous Once PRN Star Age, MD        Allergies as of 12/20/2020 - Review Complete 12/05/2020  Allergen Reaction Noted  . Sulfa antibiotics Other (See Comments) 01/27/2017    Family History  Problem Relation Age of Onset  . Hypertension Mother   . Diabetes Mother   . Coronary artery disease Mother        CABG at age 91  . Colon cancer Mother        27's  . Heart attack Mother   . Heart disease Mother   . Diabetes Sister   . Heart attack Brother   . Diabetes Brother     Social History   Socioeconomic History  . Marital status: Widowed    Spouse name: Not on file  . Number of children: 3  . Years of education: Not on file  . Highest education level: Not on file  Occupational History    Employer: [employer]  Tobacco Use  . Smoking status: Current Every Day Smoker    Packs/day: 0.50    Years: 30.00    Pack years: 15.00    Types: Cigarettes  . Smokeless tobacco: Never Used  Vaping Use  . Vaping Use: Former  Substance and Sexual Activity  . Alcohol use: Yes    Comment: occ.  . Drug use: No  . Sexual activity: Yes    Birth control/protection: Surgical  Other Topics Concern  . Not on file  Social History Narrative   Married works at [employer]    3 children grown   Social alcohol use no substances or tobacco   09/11/2017   Social Determinants of Health   Financial Resource Strain: Not on file  Food Insecurity: Not on file  Transportation Needs: Not on file  Physical Activity: Not on file  Stress: Not on file  Social Connections: Not on file  Intimate Partner Violence: Not on file    Review of Systems:    Constitutional: No weight loss, fever or chills Skin: No rash  Cardiovascular: No chest pain  Respiratory: No SOB  Gastrointestinal: See HPI and otherwise negative Genitourinary: No dysuria  Neurological: No headache, dizziness or  syncope Musculoskeletal: No new muscle or joint pain Hematologic: No bleeding  Psychiatric: No history of depression or anxiety   Physical Exam:  Vital signs: BP 116/80   Pulse 80   Ht 5\' 9"  (1.753 m)   Wt 224 lb 12.8 oz (102 kg)   BMI 33.20 kg/m   Constitutional:   Pleasant AA female appears to be in NAD, Well developed, Well nourished,  alert and cooperative Head:  Normocephalic and atraumatic. Eyes:   PEERL, EOMI. No icterus. Conjunctiva pink. Ears:  Normal auditory acuity. Neck:  Supple Throat: Oral cavity and pharynx without inflammation, swelling or lesion.  Respiratory: Respirations even and unlabored. Lungs clear to auscultation bilaterally.   No wheezes, crackles, or rhonchi.  Cardiovascular: Normal S1, S2. No MRG. Regular rate and rhythm. No peripheral edema, cyanosis or pallor.  Gastrointestinal:  Soft, nondistended, nontender. No rebound or guarding. Normal bowel sounds. No appreciable masses or hepatomegaly. Rectal:  Not performed.  Msk:  Symmetrical without gross deformities. Without edema, no deformity or joint abnormality.  Neurologic:  Alert and  oriented x4;  grossly normal neurologically.  Skin:   Dry and intact without significant lesions or rashes. Psychiatric: Demonstrates good judgement and reason without abnormal affect or behaviors.  RELEVANT LABS AND IMAGING: CBC    Component Value Date/Time   WBC 8.5 11/16/2020 1503   RBC 4.62 11/16/2020 1503   HGB 14.4 11/16/2020 1503   HCT 43.3 11/16/2020 1503   PLT 257 11/16/2020 1503   MCV 93.7 11/16/2020 1503   MCH 31.2 11/16/2020 1503   MCHC 33.3 11/16/2020 1503   RDW 14.6 11/16/2020 1503   LYMPHSABS 4.3 (H) 11/16/2020 1503   MONOABS 0.5 11/16/2020 1503   EOSABS 0.1 11/16/2020 1503   BASOSABS 0.0 11/16/2020 1503    CMP     Component Value Date/Time   NA 138 11/16/2020 1503   K 3.4 (L) 11/16/2020 1503   CL 104 11/16/2020 1503   CO2 25 11/16/2020 1503   GLUCOSE 122 (H) 11/16/2020 1503   BUN 13  11/16/2020 1503   CREATININE 0.66 11/16/2020 1503   CALCIUM 9.0 11/16/2020 1503   PROT 7.5 11/16/2020 1503   ALBUMIN 4.0 11/16/2020 1503   AST 20 11/16/2020 1503   ALT 19 11/16/2020 1503   ALKPHOS 62 11/16/2020 1503   BILITOT 0.4 11/16/2020 1503   GFRNONAA >60 11/16/2020 1503   GFRAA >90 04/13/2014 0759    Assessment: 1.  Globus sensation: For the past month, no true dysphagia, history of empiric dilation in 2013 on last EGD; consider esophagitis versus stricture versus other 2.  Family history of colon cancer in her mother: Last colonoscopy 03/06/2017 with recommendations to repeat in 5 years, will be due 03/06/2022  Plan: 1.  Patient asked to stop her Omeprazole today as it has not been helping over the past month.  Told her she could do this for now but Dr. Carlean Purl may want to start her back on something pending results from EGD. 2.  Patient scheduled for EGD with likely dilation in the [place] with Dr. Carlean Purl.  Patient was provided with a detailed list of risks for the procedure and she agrees to proceed. 3.  Patient to follow in clinic per recommendations from Dr. Carlean Purl after time of procedure.  Gina Newer, PA-C [facility] 12/20/2020, 2:49 PM  Cc: Deland Pretty, MD

## 2020-12-30 ENCOUNTER — Encounter: Payer: Self-pay | Admitting: Internal Medicine

## 2021-01-03 ENCOUNTER — Encounter: Payer: Self-pay | Admitting: Internal Medicine

## 2021-01-03 ENCOUNTER — Other Ambulatory Visit: Payer: Self-pay

## 2021-01-03 ENCOUNTER — Ambulatory Visit (AMBULATORY_SURGERY_CENTER): Payer: 59 | Admitting: Internal Medicine

## 2021-01-03 VITALS — BP 128/88 | HR 70 | Temp 97.3°F | Resp 11 | Ht 69.0 in | Wt 224.0 lb

## 2021-01-03 DIAGNOSIS — R198 Other specified symptoms and signs involving the digestive system and abdomen: Secondary | ICD-10-CM

## 2021-01-03 DIAGNOSIS — K2970 Gastritis, unspecified, without bleeding: Secondary | ICD-10-CM

## 2021-01-03 DIAGNOSIS — B9681 Helicobacter pylori [H. pylori] as the cause of diseases classified elsewhere: Secondary | ICD-10-CM

## 2021-01-03 DIAGNOSIS — K449 Diaphragmatic hernia without obstruction or gangrene: Secondary | ICD-10-CM

## 2021-01-03 DIAGNOSIS — K2950 Unspecified chronic gastritis without bleeding: Secondary | ICD-10-CM

## 2021-01-03 DIAGNOSIS — K31819 Angiodysplasia of stomach and duodenum without bleeding: Secondary | ICD-10-CM | POA: Diagnosis present

## 2021-01-03 DIAGNOSIS — R0989 Other specified symptoms and signs involving the circulatory and respiratory systems: Secondary | ICD-10-CM

## 2021-01-03 DIAGNOSIS — Q394 Esophageal web: Secondary | ICD-10-CM

## 2021-01-03 HISTORY — DX: Esophageal web: Q39.4

## 2021-01-03 HISTORY — DX: Angiodysplasia of stomach and duodenum without bleeding: K31.819

## 2021-01-03 MED ORDER — SODIUM CHLORIDE 0.9 % IV SOLN: 500.0000 mL | Freq: Once | INTRAVENOUS | Status: DC

## 2021-01-03 NOTE — Progress Notes (Signed)
Called to room to assist during endoscopic procedure.  Patient ID and intended procedure confirmed with present staff. Received instructions for my participation in the procedure from the performing physician.  

## 2021-01-03 NOTE — Progress Notes (Signed)
VS by CW. ?

## 2021-01-03 NOTE — Progress Notes (Signed)
Report to PACU, RN, vss, BBS= Clear.  

## 2021-01-03 NOTE — Patient Instructions (Addendum)
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  I dilated it.  Do not be alarmed if you see a little blood in your saliva or spit a little bit of blood up.  If you vomit large amounts of blood or have chest pain I need to know.   The stomach looked irritated as well probably not related to your symptoms but worth checking with biopsies to see if there is infection or something that needs to be treated.  There was a tiny red spot called an AVM in stomach - if causes bleeding we can fix - leave alone otherwise  I will contact you with results and plans.  I appreciate the opportunity to care for you. Gatha Mayer, MD, [facility]  Handouts given for Gastritis, Hiatal Hernia and Post-Dilation diet.   YOU HAD AN ENDOSCOPIC PROCEDURE TODAY AT [facility]:   Refer to the procedure report that was given to you for any specific questions about what was found during the examination.  If the procedure report does not answer your questions, please call your gastroenterologist to clarify.  If you requested that your care partner not be given the details of your procedure findings, then the procedure report has been included in a sealed envelope for you to review at your convenience later.  YOU SHOULD EXPECT: Some feelings of bloating in the abdomen. Passage of more gas than usual.  Walking can help get rid of the air that was put into your GI tract during the procedure and reduce the bloating. If you had a lower endoscopy (such as a colonoscopy or flexible sigmoidoscopy) you may notice spotting of blood in your stool or on the toilet paper. If you underwent a bowel prep for your procedure, you may not have a normal bowel movement for a few days.  Please Note:  You might notice some irritation and congestion in your nose or some drainage.  This is from the oxygen used during your procedure.   There is no need for concern and it should clear up in a day or so.  SYMPTOMS TO REPORT IMMEDIATELY:   Following upper endoscopy (EGD)  Vomiting of blood or coffee ground material  New chest pain or pain under the shoulder blades  Painful or persistently difficult swallowing  New shortness of breath  Fever of 100F or higher  Black, tarry-looking stools  For urgent or emergent issues, a gastroenterologist can be reached at any hour by calling [phone]. Do not use MyChart messaging for urgent concerns.    DIET:  SEE POST DILATION DIET HANDOUT, CLEAR LIQUIDS UNTIL 1145 AM THEN SOFT FOODS THE REST OF TODAY, TOMORROW you may proceed to your regular diet AS TOLERATED.  Drink plenty of fluids but you should avoid alcoholic beverages for 24 hours.  ACTIVITY:  You should plan to take it easy for the rest of today and you should NOT DRIVE or use heavy machinery until tomorrow (because of the sedation medicines used during the test).    FOLLOW UP: Our staff will call the number listed on your records 48-72 hours following your procedure to check on you and address any questions or concerns that you may have regarding the information given to you following your procedure. If we do not reach you, we will leave a message.  We will attempt to reach you two times.  During this call,  we will ask if you have developed any symptoms of COVID 19. If you develop any symptoms (ie: fever, flu-like symptoms, shortness of breath, cough etc.) before then, please call [phone].  If you test positive for Covid 19 in the 2 weeks post procedure, please call and report this information to [place].    If any biopsies were taken you will be contacted by phone or by letter within the next 1-3 weeks.  Please call us at [phone] if you have not heard about the biopsies in 3 weeks.    SIGNATURES/CONFIDENTIALITY: You and/or your care partner have signed paperwork which will be entered into your electronic medical  record.  These signatures attest to the fact that that the information above on your After Visit Summary has been reviewed and is understood.  Full responsibility of the confidentiality of this discharge information lies with you and/or your care-partner.

## 2021-01-03 NOTE — Op Note (Signed)
[facility] Patient Name: Gina [name] Procedure Date: 01/03/2021 10:09 AM MRN: [id number] Endoscopist: Gatha Mayer , MD Age: 60 Referring MD:  Date of Birth: 01-20-1961 Gender: Female Account #: [id number] Procedure:                Upper GI endoscopy Indications:              Globus sensation Medicines:                Propofol per Anesthesia, Monitored Anesthesia Care Procedure:                Pre-Anesthesia Assessment:                           - Prior to the procedure, a History and Physical                            was performed, and patient medications and                            allergies were reviewed. The patient's tolerance of                            previous anesthesia was also reviewed. The risks                            and benefits of the procedure and the sedation                            options and risks were discussed with the patient.                            All questions were answered, and informed consent                            was obtained. Prior Anticoagulants: The patient has                            taken no previous anticoagulant or antiplatelet                            agents. ASA Grade Assessment: II - A patient with                            mild systemic disease. After reviewing the risks                            and benefits, the patient was deemed in                            satisfactory condition to undergo the procedure.                           After obtaining informed consent, the endoscope was  passed under direct vision. Throughout the                            procedure, the patient's blood pressure, pulse, and                            oxygen saturations were monitored continuously. The                            Endoscope was introduced through the mouth, and                            advanced to the second part of duodenum. The upper                            GI  endoscopy was accomplished without difficulty.                            The patient tolerated the procedure well. Scope In: Scope Out: Findings:                 A web was found in the proximal esophagus. The                            scope was withdrawn. Dilation was performed with a                            Maloney dilator with mild resistance at 12 Fr. The                            dilation site was examined following endoscope                            reinsertion and showed moderate mucosal disruption.                            Estimated blood loss was minimal.                           The gastroesophageal flap valve was visualized                            endoscopically and classified as Hill Grade III                            (minimal fold, loose to endoscope, hiatal hernia                            likely).                           A small hiatal hernia was present.                           Diffuse mild inflammation characterized by  erythema, friability mottling and mucus was found                            in the gastric antrum. Biopsies were taken with a                            cold forceps for histology. Verification of patient                            identification for the specimen was done. Estimated                            blood loss was minimal.                           A single diminutive angiodysplastic lesion with no                            bleeding was found in the prepyloric region of the                            stomach.                           The examined duodenum was normal. Complications:            No immediate complications. Estimated Blood Loss:     Estimated blood loss was minimal. Impression:               - Web in the proximal esophagus. Dilated.                           - Gastroesophageal flap valve classified as Hill                            Grade III (minimal fold, loose to endoscope, hiatal                             hernia likely).                           - Small hiatal hernia. irregular Z-line associated                           - Gastritis. Biopsied.                           - A single non-bleeding angiodysplastic lesion in                            the stomach.                           - Normal examined duodenum. Recommendation:           - Patient has a contact number available for  emergencies. The signs and symptoms of potential                            delayed complications were discussed with the                            patient. Return to normal activities tomorrow.                            Written discharge instructions were provided to the                            patient.                           - Clear liquids x 1 hour then soft foods rest of                            day. Start prior diet tomorrow.                           - Continue present medications.                           - Await pathology results. Gatha Mayer, MD 01/03/2021 10:48:25 AM This report has been signed electronically.

## 2021-01-05 ENCOUNTER — Telehealth: Payer: Self-pay

## 2021-01-05 NOTE — Telephone Encounter (Signed)
  Follow up Call-  Call back number 01/03/2021  Post procedure Call Back phone  # [phone]  Permission to leave phone message Yes  Some recent data might be hidden     Patient questions:  Do you have a fever, pain , or abdominal swelling? No. Pain Score  0 *  Have you tolerated food without any problems? Yes.    Have you been able to return to your normal activities? Yes.    Do you have any questions about your discharge instructions: Diet   No. Medications  No. Follow up visit  No.  Do you have questions or concerns about your Care? No.  Actions: * If pain score is 4 or above: No action needed, pain <4.  Per Pt "my throat is a little sore still".  I advised this is expected.  She is eating and tolerating food and back to her normal activity.  To call us back if not resolved. Maw   1. Have you developed a fever since your procedure? no  2.   Have you had an respiratory symptoms (SOB or cough) since your procedure? no  3.   Have you tested positive for COVID 19 since your procedure no  4.   Have you had any family members/close contacts diagnosed with the COVID 19 since your procedure?  no   If yes to any of these questions please route to Joylene John, RN and Joella Prince, RN

## 2021-01-17 ENCOUNTER — Other Ambulatory Visit: Payer: Self-pay

## 2021-01-17 DIAGNOSIS — A048 Other specified bacterial intestinal infections: Secondary | ICD-10-CM

## 2021-01-17 MED ORDER — BISMUTH SUBSALICYLATE 262 MG PO CHEW: 524.0000 mg | CHEWABLE_TABLET | Freq: Four times a day (QID) | ORAL | 0 refills | Status: AC

## 2021-01-17 MED ORDER — METRONIDAZOLE 250 MG PO TABS: 250.0000 mg | ORAL_TABLET | Freq: Four times a day (QID) | ORAL | 0 refills | Status: AC

## 2021-01-17 MED ORDER — DOXYCYCLINE HYCLATE 100 MG PO CAPS: 100.0000 mg | ORAL_CAPSULE | Freq: Two times a day (BID) | ORAL | 0 refills | Status: AC

## 2021-01-17 MED ORDER — OMEPRAZOLE 20 MG PO CPDR: 20.0000 mg | DELAYED_RELEASE_CAPSULE | Freq: Two times a day (BID) | ORAL | 0 refills | Status: DC

## 2021-01-20 ENCOUNTER — Other Ambulatory Visit: Payer: Self-pay | Admitting: Internal Medicine

## 2021-02-02 ENCOUNTER — Telehealth: Payer: Self-pay | Admitting: Physician Assistant

## 2021-02-02 NOTE — Telephone Encounter (Signed)
Spoke with patient, she called in to clarify when she needed to return for the H. Pylori stool study, she reports that today is the last day of her antibiotics. Advised patient that we will call to remind her in about 4 weeks to come in for the stool study. She is aware that no appt is necessary and she can stop by at her convenience to pick up the kit and instructions at that time. Pt states that she has been experiencing some constipation, advised that she can take Miralax up to 3 times a day or the Milk of magnesia if that was her preference. Patient has been reminded to hold Omeprazole until after her stool study. Patient verbalized understanding of all information and had no concerns at the end of the call.

## 2021-02-02 NOTE — Telephone Encounter (Signed)
Pt is requesting a call back from a nurse to discuss her instructions that Dr Carlean Purl placed and when she needs to follow up with him.

## 2021-03-06 ENCOUNTER — Other Ambulatory Visit: Payer: 59

## 2021-03-06 DIAGNOSIS — A048 Other specified bacterial intestinal infections: Secondary | ICD-10-CM

## 2021-03-07 LAB — HELICOBACTER PYLORI  SPECIAL ANTIGEN
MICRO NUMBER:: 11894433
SPECIMEN QUALITY: ADEQUATE

## 2021-04-17 ENCOUNTER — Telehealth: Payer: Self-pay

## 2021-04-17 NOTE — Telephone Encounter (Signed)
Telephone encounter:  Reason for call: Patient called and said she is having swelling in her left leg for about 1 week and she didn't know if she needed to f/u with you or pcp  Usual provider: [facility]  Last office visit: 08/05/20  Next office visit: 08/03/21   Last hospitalization: 11/16/20  Current Outpatient Medications on File Prior to Visit  Medication Sig Dispense Refill   Ascorbic Acid (VITAMIN C) 100 MG tablet Take 100 mg by mouth daily.     aspirin EC 81 MG tablet Take 1 tablet (81 mg total) by mouth daily. 90 tablet 3   fluticasone (FLONASE) 50 MCG/ACT nasal spray Place 2 sprays into the nose as needed. Reported on 12/20/2015     ibuprofen (ADVIL,MOTRIN) 600 MG tablet Take 1 tablet (600 mg total) by mouth every 6 (six) hours as needed. 30 tablet 0   montelukast (SINGULAIR) 10 MG tablet Take 10 mg by mouth at bedtime. (Patient not taking: No sig reported)     nicotine (NICODERM CQ - DOSED IN MG/24 HOURS) 21 mg/24hr patch 21 mg daily. (Patient not taking: No sig reported)     omeprazole (PRILOSEC) 20 MG capsule Take 1 capsule (20 mg total) by mouth daily. 30 capsule 1   omeprazole (PRILOSEC) 20 MG capsule Take 1 capsule (20 mg total) by mouth 2 (two) times daily for 14 days. 28 capsule 0   PROAIR HFA 108 (90 Base) MCG/ACT inhaler  (Patient not taking: Reported on 01/03/2021)     rosuvastatin (CRESTOR) 40 MG tablet Take 1 tablet (40 mg total) by mouth daily. 90 tablet 3   VITAMIN D PO Take by mouth daily. (Patient not taking: Reported on 01/03/2021)     Current Facility-Administered Medications on File Prior to Visit  Medication Dose Route Frequency Provider Last Rate Last Admin   gadopentetate dimeglumine (MAGNEVIST) injection 20 mL  20 mL Intravenous Once PRN Star Age, MD

## 2021-04-17 NOTE — Telephone Encounter (Signed)
Either is fine. I last saw her in 07/2020. At that time, I had felt leg edema is due to venous insufficiency. Recommend leg elevation at night, usage of compression stockings during the day, f/u w/either PCP or me.  Thanks MJP

## 2021-04-17 NOTE — Telephone Encounter (Signed)
She mentioned about having a blood clot in her leg previously

## 2021-04-17 NOTE — Telephone Encounter (Signed)
Schedule f/u w/either PCP or me  Thanks MJP

## 2021-07-14 ENCOUNTER — Other Ambulatory Visit: Payer: Self-pay | Admitting: *Deleted

## 2021-07-14 DIAGNOSIS — F17210 Nicotine dependence, cigarettes, uncomplicated: Secondary | ICD-10-CM

## 2021-07-20 ENCOUNTER — Other Ambulatory Visit: Payer: Self-pay

## 2021-07-20 ENCOUNTER — Ambulatory Visit (INDEPENDENT_AMBULATORY_CARE_PROVIDER_SITE_OTHER)
Admission: RE | Admit: 2021-07-20 | Discharge: 2021-07-20 | Disposition: A | Discharge status: Home / Self Care | Payer: 59 | Source: Ambulatory Visit | Attending: Acute Care | Admitting: Acute Care

## 2021-07-20 DIAGNOSIS — F17210 Nicotine dependence, cigarettes, uncomplicated: Secondary | ICD-10-CM

## 2021-07-27 ENCOUNTER — Other Ambulatory Visit: Payer: Self-pay

## 2021-07-27 ENCOUNTER — Ambulatory Visit
Admission: RE | Admit: 2021-07-27 | Discharge: 2021-07-27 | Disposition: A | Discharge status: Home / Self Care | Payer: 59 | Source: Ambulatory Visit | Attending: Internal Medicine | Admitting: Internal Medicine

## 2021-07-27 ENCOUNTER — Other Ambulatory Visit: Payer: Self-pay | Admitting: Internal Medicine

## 2021-07-27 DIAGNOSIS — Z1231 Encounter for screening mammogram for malignant neoplasm of breast: Secondary | ICD-10-CM

## 2021-08-03 ENCOUNTER — Encounter: Payer: Self-pay | Admitting: Cardiology

## 2021-08-03 ENCOUNTER — Other Ambulatory Visit: Payer: Self-pay

## 2021-08-03 ENCOUNTER — Ambulatory Visit: Payer: 59 | Admitting: Cardiology

## 2021-08-03 VITALS — BP 132/85 | HR 69 | Temp 97.9°F | Resp 16 | Ht 69.0 in | Wt 229.0 lb

## 2021-08-03 DIAGNOSIS — E782 Mixed hyperlipidemia: Secondary | ICD-10-CM

## 2021-08-03 DIAGNOSIS — I491 Atrial premature depolarization: Secondary | ICD-10-CM

## 2021-08-03 DIAGNOSIS — M7989 Other specified soft tissue disorders: Secondary | ICD-10-CM

## 2021-08-03 DIAGNOSIS — I2510 Atherosclerotic heart disease of native coronary artery without angina pectoris: Secondary | ICD-10-CM

## 2021-08-03 NOTE — Progress Notes (Signed)
Patient referred by Deland Pretty, MD for coronary calcification  Subjective:   Gina [name], female    DOB: [birth year], 60 y.o.   MRN: [id number]   Chief Complaint  Patient presents with   Coronary artery disease involving native coronary artery of   Follow-up    1 year   HPI  60 y.o. American female with coronary atherosclerosis   Office note 07/2020: Patient was seen by me in 08/2019 for similar findings of coronary atherosclerosis on lung cancer screening CT chest. At that time, she was walking 15,000 steps/year while at work. Now that she is retired, her walking has gone down. She has noticed retrosternal chest tightness, mostly at rest. She has stable, unchanged mild exertional dyspnea. Unfortunately, she continues to smoke 1 PPD. She is using nicotine patches to try to quit.   Since then, patient underwent exercise nuclear stress test that showed low exercise capacity, but no ischemia.  She is doing well.  She denies any chest pain or shortness of breath symptoms.  She has had swelling in her left leg.  She has not had ultrasound of her left leg in a long time.  Reviewed recent CT chest, and lab results with the patient, details below.  Current Outpatient Medications on File Prior to Visit  Medication Sig Dispense Refill   Ascorbic Acid (VITAMIN C) 100 MG tablet Take 100 mg by mouth daily.     aspirin EC 81 MG tablet Take 1 tablet (81 mg total) by mouth daily. 90 tablet 3   fluticasone (FLONASE) 50 MCG/ACT nasal spray Place 2 sprays into the nose as needed. Reported on 12/20/2015     ibuprofen (ADVIL,MOTRIN) 600 MG tablet Take 1 tablet (600 mg total) by mouth every 6 (six) hours as needed. 30 tablet 0   montelukast (SINGULAIR) 10 MG tablet Take 10 mg by mouth at bedtime. (Patient not taking: No sig reported)     nicotine (NICODERM CQ - DOSED IN MG/24 HOURS) 21 mg/24hr patch 21 mg daily. (Patient not taking: No sig reported)     omeprazole (PRILOSEC) 20 MG capsule  Take 1 capsule (20 mg total) by mouth daily. 30 capsule 1   omeprazole (PRILOSEC) 20 MG capsule Take 1 capsule (20 mg total) by mouth 2 (two) times daily for 14 days. 28 capsule 0   PROAIR HFA 108 (90 Base) MCG/ACT inhaler  (Patient not taking: Reported on 01/03/2021)     rosuvastatin (CRESTOR) 40 MG tablet Take 1 tablet (40 mg total) by mouth daily. 90 tablet 3   VITAMIN D PO Take by mouth daily. (Patient not taking: Reported on 01/03/2021)     Current Facility-Administered Medications on File Prior to Visit  Medication Dose Route Frequency Provider Last Rate Last Admin   gadopentetate dimeglumine (MAGNEVIST) injection 20 mL  20 mL Intravenous Once PRN Star Age, MD        Cardiovascular studies:  EKG 08/03/2021: Sinus rhythm 80 bpm Frequent PACs   Exercise tetrofosmin stress test  08/15/2020: Normal ECG stress. The patient exercised for 4 minutes and 39 seconds of a Bruce protocol, achieving approximately 6.6 METs.  Normal BP response. Accelerated heart rate response suggests aerobic intolerance. Myocardial perfusion is normal. Overall LV systolic function is normal without regional wall motion abnormalities. Stress LV EF: 76%.  No previous exam available for comparison. Low risk.   EKG 08/05/2020: Sinus rhythm 80 bpm Borderline left atrial anlargement  CT chest 07/20/2021: 1. Lung-RADS 2S, benign appearance or behavior. Continue  annual screening with low-dose chest CT without contrast in 12 months. 2. The "S" modifier above refers to potentially clinically significant non lung cancer related findings. Specifically, there is aortic atherosclerosis, in addition to two vessel coronary artery disease. Please note that although the presence of coronary artery calcium documents the presence of coronary artery disease, the severity of this disease and any potential stenosis cannot be assessed on this non-gated CT examination. Assessment for potential risk factor modification, dietary  therapy or pharmacologic therapy may be warranted, if clinically indicated. 3. Mild diffuse bronchial wall thickening with mild centrilobular and paraseptal emphysema; imaging findings suggestive of underlying COPD. 4. Old granulomatous disease, as above.   Aortic Atherosclerosis (ICD10-I70.0) and Emphysema (ICD10-J43.9).  ABI 08/31/2019: This exam reveals normal perfusion of the right [name] extremity (ABI  1.35). This exam reveals normal perfusion of the left [name] extremity (ABI  1.35).  Mildly abnormal biphasic waveforms at the bilateral ankle may suggest mild  diffuse disease. Clinical correlation recommended.  Recent labs: 06/27/2021: Chol 181, TG 61, HDL 64, LDL 55  11/16/2020: Glucose 122, BUN/Cr 13/0.66. EGFR >60. Na/K 138/3.4. Rest of the CMP normal H/H 14/43. MCV 93. Platelets 257  06/23/2020: Glucose 87, BUN/Cr 16/0.68. EGFR normal. Na/K 140/4.7. Rest of the CMP normal H/H 15/45. MCV 90. Platelets 292 Chol 159, TG 67, HDL 59, LDL 87  06/15/2019: Glucose 80.  BUN/creatinine 17/0.78.  eGFR normal.  Sodium 143, potassium 5.3.  Rest of the CMP normal. H/H 14/44.  MCV 90.  Platelets 304. Cholesterol 185, TG 49, HDL 51, LDL 124   Review of Systems  Cardiovascular:  Positive for claudication and leg swelling. Negative for chest pain, dyspnea on exertion, palpitations and syncope.        Vitals:   08/03/21 0926  BP: 132/85  Pulse: 69  Resp: 16  Temp: 97.9 F (36.6 C)  SpO2: 98%    Body mass index is 33.82 kg/m. Filed Weights   08/03/21 0926  Weight: 229 lb (103.9 kg)     Objective:   Physical Exam Vitals and nursing note reviewed.  Constitutional:      General: She is not in acute distress. Neck:     Vascular: No JVD.  Cardiovascular:     Rate and Rhythm: Normal rate and regular rhythm.     Heart sounds: Normal heart sounds. No murmur heard.    Comments: B/l LE varicocities Pulmonary:     Effort: Pulmonary effort is normal.     Breath sounds: Normal  breath sounds. No wheezing or rales.        Assessment & Recommendations:   60 y.o. American female with coronary atherosclerosis, tobacco dependance  Coronary atherosclerosis: No angina symptoms. No ischemia on stress testing in 2021. Reasonable to omit aspirin. Continue Crestor 40 mg daily.  Lipids very well controlled. Continue current antihypertensive therapy.  Blood pressure well controlled. Encouraged heart healthy diet and lifestyle, no smoking.  PAC: Frequent PACs on EKG.  Will check echocardiogram.  Leg edema: Likely due to venous insufficiency. Recommend leg elevation, compression stockings. Nonetheless, will obtain DVT study to rule out DVT.  F/u in 1 year  Nigel Mormon, MD [facility] Pager: [phone] Office: [phone]

## 2021-08-04 ENCOUNTER — Encounter: Payer: Self-pay | Admitting: *Deleted

## 2021-08-04 DIAGNOSIS — F17210 Nicotine dependence, cigarettes, uncomplicated: Secondary | ICD-10-CM

## 2021-08-14 ENCOUNTER — Other Ambulatory Visit: Payer: Self-pay

## 2021-08-14 ENCOUNTER — Ambulatory Visit: Payer: 59

## 2021-08-14 DIAGNOSIS — M7989 Other specified soft tissue disorders: Secondary | ICD-10-CM

## 2021-08-14 DIAGNOSIS — I491 Atrial premature depolarization: Secondary | ICD-10-CM

## 2021-08-15 ENCOUNTER — Other Ambulatory Visit: Payer: 59

## 2021-08-25 ENCOUNTER — Other Ambulatory Visit: Payer: Self-pay | Admitting: Cardiology

## 2021-08-25 DIAGNOSIS — I2510 Atherosclerotic heart disease of native coronary artery without angina pectoris: Secondary | ICD-10-CM

## 2021-08-25 DIAGNOSIS — E782 Mixed hyperlipidemia: Secondary | ICD-10-CM

## 2022-04-28 ENCOUNTER — Encounter: Payer: Self-pay | Admitting: Internal Medicine

## 2022-05-08 ENCOUNTER — Encounter: Payer: Self-pay | Admitting: Internal Medicine

## 2022-06-08 ENCOUNTER — Telehealth: Payer: Self-pay

## 2022-06-08 ENCOUNTER — Encounter: Payer: Self-pay | Admitting: Internal Medicine

## 2022-06-08 ENCOUNTER — Ambulatory Visit: Payer: 59

## 2022-06-08 VITALS — Ht 69.0 in | Wt 236.0 lb

## 2022-06-08 DIAGNOSIS — Z800 Family history of malignant neoplasm of digestive organs: Secondary | ICD-10-CM

## 2022-06-08 DIAGNOSIS — Z86010 Personal history of colonic polyps: Secondary | ICD-10-CM

## 2022-06-08 MED ORDER — NA SULFATE-K SULFATE-MG SULF 17.5-3.13-1.6 GM/177ML PO SOLN: 1.0000 | ORAL | 0 refills | Status: DC

## 2022-06-08 NOTE — Progress Notes (Addendum)
No egg or soy allergy known to patient  No issues known to pt with past sedation with any surgeries or procedures Patient denies ever being told they had issues or difficulty with intubation  No FH of Malignant Hyperthermia Pt is not on diet pills Pt is not on  home 02  Pt is not on blood thinners  Pt denies issues with constipation  No A fib or A flutter Have any cardiac testing pending--denied Pt instructed to use [email or web address] or GoodRx for a price reduction on prep   15:35 06/08/22: After review by Dr. Carlean Purl, single dose Miralax prep was recommended rather than the 2-day Miralax-Suprep discussed this morning with pt in PreVisit.  New Miralax instructions are now sent to pt via MyChart. Spoke with pt re: no EGD, prep changes to only Miralax. She agrees to review new MyChart instructions and call back with any questions.

## 2022-06-08 NOTE — Telephone Encounter (Signed)
I called the patient and reviewed things.  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  The globus sensation is gone but she is having some reflux sensation.  This occurs at night when lying down.   She also asks why she is having a double prep.  She did MiraLAX in 2018 and the prep was fine so I am puzzled by this she denies significant constipation.   I am going to send her a MyChart message about using Pepcid and a reflux diet (we did discuss).   It seems to me she does not need a double prep.  Unless there is something we have missed here (the patient and me) please change it to a single prep and MiraLAX is okay.

## 2022-06-08 NOTE — Telephone Encounter (Signed)
Dr. Carlean Purl, with PV this am for Colonoscopy scheduled for 9/8, pt reports "lump" sensation in throat but denies dysphagia. She asks if EGD can be performed with Colonoscopy. Chart review revealed similar s/s and EDG with dilation March 2022.  Would you recommend adding EGD, scheduling OV, or proceeding with colonoscopy only as scheduled?  Thanks, Aaron Edelman (PV)

## 2022-06-15 ENCOUNTER — Encounter: Payer: Self-pay | Admitting: Internal Medicine

## 2022-06-24 ENCOUNTER — Encounter: Payer: Self-pay | Admitting: Certified Registered Nurse Anesthetist

## 2022-06-28 ENCOUNTER — Other Ambulatory Visit: Payer: Self-pay | Admitting: Internal Medicine

## 2022-06-28 DIAGNOSIS — Z1231 Encounter for screening mammogram for malignant neoplasm of breast: Secondary | ICD-10-CM

## 2022-06-28 NOTE — Progress Notes (Signed)
[facility] History and Physical   Primary Care Physician:  Deland Pretty, MD   Reason for Procedure:  Family history of colon cancer  Plan:    Colonoscopy     HPI: Gina [name] is a 61 y.o. female with a family history of colon cancer here for screening colonoscopy.  She did have multiple diminutive polyps on colonoscopy in 2018 but these were not precancerous.   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   Hiatal hernia    Irritable bowel syndrome    Unspecified vitamin D deficiency     Past Surgical History:  Procedure Laterality Date   ABDOMINAL HYSTERECTOMY     ANAL RECTAL MANOMETRY N/A 09/16/2017   Procedure: ANO RECTAL MANOMETRY;  Surgeon: Mauri Pole, MD;  Location: [facility];  Service: Endoscopy;  Laterality: N/A;   COLONOSCOPY  multiple   ESOPHAGEAL DILATION     EYE SURGERY     GANGLION CYST EXCISION Left    ILEOSTOMY     ILEOSTOMY CLOSURE     RECTOVAGINAL FISTULA CLOSURE     SHOULDER SURGERY     SHOULDER SURGERY     UPPER GASTROINTESTINAL ENDOSCOPY      Prior to Admission medications   Medication Sig Start Date End Date Taking? Authorizing Provider  fluticasone (FLONASE) 50 MCG/ACT nasal spray Place 2 sprays into the nose as needed. Reported on 12/20/2015    [provider]  ibuprofen (ADVIL,MOTRIN) 600 MG tablet Take 1 tablet (600 mg total) by mouth every 6 (six) hours as needed. 01/27/17   Shary Decamp, PA-C  Na Sulfate-K Sulfate-Mg Sulf 17.5-3.13-1.6 GM/177ML SOLN Take 1 kit by mouth as directed. May use generic Suprep, no prior authorization. Take as directed. 06/08/22   Gatha Mayer, MD  PROAIR HFA 108 [phone] Base) MCG/ACT inhaler  01/30/17   [provider]   rosuvastatin (CRESTOR) 40 MG tablet TAKE 1 TABLET(40 MG) BY MOUTH DAILY 08/25/21   Patwardhan, Reynold Bowen, MD  VITAMIN D PO Take by mouth daily. Patient not taking: Reported on 06/08/2022    [provider]    Current Outpatient Medications  Medication Sig Dispense Refill   fluticasone (FLONASE) 50 MCG/ACT nasal spray Place 2 sprays into the nose as needed. Reported on 12/20/2015     ibuprofen (ADVIL,MOTRIN) 600 MG tablet Take 1 tablet (600 mg total) by mouth every 6 (six) hours as needed. 30 tablet 0   PROAIR HFA 108 (90 Base) MCG/ACT inhaler      rosuvastatin (CRESTOR) 40 MG tablet TAKE 1 TABLET(40 MG) BY MOUTH DAILY 90 tablet 3   VITAMIN D PO Take by mouth daily. (Patient not taking: Reported on 06/08/2022)     Current Facility-Administered Medications  Medication Dose Route Frequency Provider Last Rate Last Admin   0.9 %  sodium chloride infusion  500 mL Intravenous Once Gatha Mayer, MD       Facility-Administered Medications Ordered in Other Visits  Medication Dose Route Frequency Provider Last Rate Last Admin   gadopentetate dimeglumine (MAGNEVIST) injection 20 mL  20 mL Intravenous Once PRN Star Age, MD        Allergies as of 06/29/2022 - Review Complete 06/29/2022  Allergen Reaction Noted  Sulfa antibiotics Other (See Comments) 01/27/2017    Family History  Problem Relation Age of Onset   Hypertension Mother    Diabetes Mother    Coronary artery disease Mother        CABG at age 60   Colon cancer Mother        34's   Heart attack Mother    Heart disease Mother    Colon polyps Sister    Diabetes Sister    Colon polyps Sister    Heart attack Brother    Diabetes Brother    Esophageal cancer Neg Hx    Rectal cancer Neg Hx    Stomach cancer Neg Hx     Social History   Socioeconomic History   Marital status: Widowed    Spouse name: Not on file   Number of children: 3   Years of education: Not on file   Highest education level: Not on file   Occupational History    Employer: [employer]  Tobacco Use   Smoking status: Every Day    Packs/day: 0.50    Years: 30.00    Total pack years: 15.00    Types: Cigarettes   Smokeless tobacco: Never  Vaping Use   Vaping Use: Former  Substance and Sexual Activity   Alcohol use: Yes    Comment: occ.   Drug use: No   Sexual activity: Yes    Birth control/protection: Surgical  Other Topics Concern   Not on file  Social History Narrative   Married works at [employer]    3 children grown   Social alcohol use no substances or tobacco   09/11/2017   Social Determinants of Health   Financial Resource Strain: Not on file  Food Insecurity: Not on file  Transportation Needs: Not on file  Physical Activity: Not on file  Stress: Not on file  Social Connections: Not on file  Intimate Partner Violence: Not on file    Review of Systems:  All other review of systems negative except as mentioned in the HPI.  Physical Exam: Vital signs BP (!) 122/54   Pulse 90   Temp (!) 97.3 F (36.3 C)   Ht _0  (1.753 m)   Wt 236 lb (107 kg)   SpO2 99%   BMI 34.85 kg/m   General:   Alert,  Well-developed, well-nourished, pleasant and cooperative in NAD Lungs:  Clear throughout to auscultation.   Heart:  Regular rate and rhythm; no murmurs, clicks, rubs,  or gallops. Abdomen:  Soft, nontender and nondistended. Normal bowel sounds.   Neuro/Psych:  Alert and cooperative. Normal mood and affect. A and O x 3   _1  E. Carlean Purl, MD, [facility] [phone] (pager) 06/29/2022 8:46 AM@

## 2022-06-29 ENCOUNTER — Encounter: Payer: Self-pay | Admitting: Internal Medicine

## 2022-06-29 ENCOUNTER — Ambulatory Visit (AMBULATORY_SURGERY_CENTER): Payer: 59 | Admitting: Internal Medicine

## 2022-06-29 VITALS — BP 119/67 | HR 75 | Temp 97.3°F | Resp 20 | Ht 69.0 in | Wt 236.0 lb

## 2022-06-29 DIAGNOSIS — Z86010 Personal history of colonic polyps: Secondary | ICD-10-CM

## 2022-06-29 DIAGNOSIS — Z800 Family history of malignant neoplasm of digestive organs: Secondary | ICD-10-CM

## 2022-06-29 DIAGNOSIS — Z09 Encounter for follow-up examination after completed treatment for conditions other than malignant neoplasm: Secondary | ICD-10-CM

## 2022-06-29 MED ORDER — SODIUM CHLORIDE 0.9 % IV SOLN: 500.0000 mL | Freq: Once | INTRAVENOUS | Status: DC

## 2022-06-29 NOTE — Patient Instructions (Addendum)
Normal colonoscopy today.  Your next routine colonoscopy should be in 5 years - 2028.   I appreciate the opportunity to care for you. Gatha Mayer, MD, FACG  YOU HAD AN ENDOSCOPIC PROCEDURE TODAY AT [facility]:   Refer to the procedure report that was given to you for any specific questions about what was found during the examination.  If the procedure report does not answer your questions, please call your gastroenterologist to clarify.  If you requested that your care partner not be given the details of your procedure findings, then the procedure report has been included in a sealed envelope for you to review at your convenience later.  YOU SHOULD EXPECT: Some feelings of bloating in the abdomen. Passage of more gas than usual.  Walking can help get rid of the air that was put into your GI tract during the procedure and reduce the bloating. If you had a lower endoscopy (such as a colonoscopy or flexible sigmoidoscopy) you may notice spotting of blood in your stool or on the toilet paper. If you underwent a bowel prep for your procedure, you may not have a normal bowel movement for a few days.  Please Note:  You might notice some irritation and congestion in your nose or some drainage.  This is from the oxygen used during your procedure.  There is no need for concern and it should clear up in a day or so.  SYMPTOMS TO REPORT IMMEDIATELY:  Following lower endoscopy (colonoscopy or flexible sigmoidoscopy):  Excessive amounts of blood in the stool  Significant tenderness or worsening of abdominal pains  Swelling of the abdomen that is new, acute  Fever of 100F or higher  Following upper endoscopy (EGD)  Vomiting of blood or coffee ground material  New chest pain or pain under the shoulder blades  Painful or persistently difficult swallowing  New shortness of breath  Fever of 100F or higher  Black, tarry-looking stools  For urgent or emergent issues, a  gastroenterologist can be reached at any hour by calling [phone]. Do not use MyChart messaging for urgent concerns.    DIET:  We do recommend a small meal at first, but then you may proceed to your regular diet.  Drink plenty of fluids but you should avoid alcoholic beverages for 24 hours.  ACTIVITY:  You should plan to take it easy for the rest of today and you should NOT DRIVE or use heavy machinery until tomorrow (because of the sedation medicines used during the test).    FOLLOW UP: Our staff will call the number listed on your records the next business day following your procedure.  We will call around 7:15- 8:00 am to check on you and address any questions or concerns that you may have regarding the information given to you following your procedure. If we do not reach you, we will leave a message.  If you develop any symptoms (ie: fever, flu-like symptoms, shortness of breath, cough etc.) before then, please call [phone].  If you test positive for Covid 19 in the 2 weeks post procedure, please call and report this information to [place].    If any biopsies were taken you will be contacted by phone or by letter within the next 1-3 weeks.  Please call us at [phone] if you have not heard about the biopsies in 3 weeks.    SIGNATURES/CONFIDENTIALITY: You and/or your care partner have signed paperwork which will be entered into your electronic  medical record.  These signatures attest to the fact that that the information above on your After Visit Summary has been reviewed and is understood.  Full responsibility of the confidentiality of this discharge information lies with you and/or your care-partner.  

## 2022-06-29 NOTE — Op Note (Signed)
[facility] Patient Name: Gina [name] Procedure Date: 06/29/2022 8:35 AM MRN: [id number] Endoscopist: Gatha Mayer , MD Age: 61 Referring MD:  Date of Birth: Jan 27, 1961 Gender: Female Account #: [id number] Procedure:                Colonoscopy Indications:              Screening in patient at increased risk: Family                            history of 1st-degree relative with colorectal                            cancer Medicines:                Monitored Anesthesia Care Procedure:                Pre-Anesthesia Assessment:                           - Prior to the procedure, a History and Physical                            was performed, and patient medications and                            allergies were reviewed. The patient's tolerance of                            previous anesthesia was also reviewed. The risks                            and benefits of the procedure and the sedation                            options and risks were discussed with the patient.                            All questions were answered, and informed consent                            was obtained. Prior Anticoagulants: The patient has                            taken no previous anticoagulant or antiplatelet                            agents. ASA Grade Assessment: III - A patient with                            severe systemic disease. After reviewing the risks                            and benefits, the patient was deemed in  satisfactory condition to undergo the procedure.                           After obtaining informed consent, the colonoscope                            was passed under direct vision. Throughout the                            procedure, the patient's blood pressure, pulse, and                            oxygen saturations were monitored continuously. The                            CF [id number] #[id number] was introduced through the anus                             and advanced to the the cecum, identified by                            appendiceal orifice and ileocecal valve. The                            colonoscopy was performed without difficulty. The                            patient tolerated the procedure well. The quality                            of the bowel preparation was good. The ileocecal                            valve, appendiceal orifice, and rectum were                            photographed. The bowel preparation used was                            Miralax via split dose instruction. Scope In: 8:54:38 AM Scope Out: 9:07:10 AM Scope Withdrawal Time: 0 hours 9 minutes 43 seconds  Total Procedure Duration: 0 hours 12 minutes 32 seconds  Findings:                 The perianal and digital rectal examinations were                            normal.                           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 Complications:            No immediate complications. Estimated Blood Loss:     Estimated blood loss: none. 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                           - No specimens collected. Recommendation:           - Patient has a contact number available for                            emergencies. The signs and symptoms of potential                            delayed complications were discussed with the                            patient. Return to normal activities tomorrow.                            Written discharge instructions were provided to the                            patient.                           - Resume previous diet.                            - Continue present medications.                           - Repeat colonoscopy in 5 years for screening                            purposes. Gatha Mayer, MD 06/29/2022 9:15:00 AM This report has been signed electronically.

## 2022-06-29 NOTE — Progress Notes (Signed)
Report given to PACU, vss 

## 2022-06-29 NOTE — Progress Notes (Signed)
Pt's states no medical or surgical changes since previsit or office visit. 

## 2022-07-02 ENCOUNTER — Telehealth: Payer: Self-pay

## 2022-07-02 NOTE — Telephone Encounter (Signed)
  Follow up Call-     06/29/2022    8:16 AM 01/03/2021    9:31 AM  Call back number  Post procedure Call Back phone  # [phone]  Permission to leave phone message Yes Yes     Patient questions:  Do you have a fever, pain , or abdominal swelling? No. Pain Score  0 *  Have you tolerated food without any problems? Yes.    Have you been able to return to your normal activities? Yes.    Do you have any questions about your discharge instructions: Diet   No. Medications  No. Follow up visit  No.  Do you have questions or concerns about your Care? No.  Actions: * If pain score is 4 or above: No action needed, pain <4.

## 2022-07-20 ENCOUNTER — Ambulatory Visit
Admission: RE | Admit: 2022-07-20 | Discharge: 2022-07-20 | Disposition: A | Discharge status: Home / Self Care | Payer: 59 | Source: Ambulatory Visit | Attending: Internal Medicine | Admitting: Internal Medicine

## 2022-07-20 DIAGNOSIS — F17210 Nicotine dependence, cigarettes, uncomplicated: Secondary | ICD-10-CM

## 2022-07-24 ENCOUNTER — Other Ambulatory Visit: Payer: Self-pay

## 2022-07-24 DIAGNOSIS — Z122 Encounter for screening for malignant neoplasm of respiratory organs: Secondary | ICD-10-CM

## 2022-07-24 DIAGNOSIS — Z87891 Personal history of nicotine dependence: Secondary | ICD-10-CM

## 2022-07-24 DIAGNOSIS — F17210 Nicotine dependence, cigarettes, uncomplicated: Secondary | ICD-10-CM

## 2022-07-25 ENCOUNTER — Ambulatory Visit: Payer: 59 | Admitting: Pulmonary Disease

## 2022-07-25 ENCOUNTER — Encounter: Payer: Self-pay | Admitting: Pulmonary Disease

## 2022-07-25 VITALS — BP 110/70 | HR 90 | Ht 68.0 in | Wt 236.0 lb

## 2022-07-25 DIAGNOSIS — J432 Centrilobular emphysema: Secondary | ICD-10-CM

## 2022-07-25 DIAGNOSIS — R918 Other nonspecific abnormal finding of lung field: Secondary | ICD-10-CM | POA: Diagnosis not present

## 2022-07-25 MED ORDER — BREZTRI AEROSPHERE 160-9-4.8 MCG/ACT IN AERO: 2.0000 | INHALATION_SPRAY | Freq: Two times a day (BID) | RESPIRATORY_TRACT | 6 refills | Status: AC

## 2022-07-25 MED ORDER — NICOTINE 14 MG/24HR TD PT24: 14.0000 mg | MEDICATED_PATCH | Freq: Every day | TRANSDERMAL | 3 refills | Status: DC

## 2022-07-25 MED ORDER — ALBUTEROL SULFATE HFA 108 (90 BASE) MCG/ACT IN AERS: 2.0000 | INHALATION_SPRAY | Freq: Four times a day (QID) | RESPIRATORY_TRACT | 6 refills | Status: DC | PRN

## 2022-07-25 NOTE — Patient Instructions (Addendum)
Thank you for visiting Dr. Valeta Harms at [facility]. Today we recommend the following:  Orders Placed This Encounter  Procedures   Pulmonary Function Test   Meds ordered this encounter  Medications   Budeson-Glycopyrrol-Formoterol (BREZTRI AEROSPHERE) 160-9-4.8 MCG/ACT AERO    Sig: Inhale 2 puffs into the lungs in the morning and at bedtime.    Dispense:  10.7 g    Refill:  6   albuterol (VENTOLIN HFA) 108 (90 Base) MCG/ACT inhaler    Sig: Inhale 2 puffs into the lungs every 6 (six) hours as needed for wheezing or shortness of breath.    Dispense:  8 g    Refill:  6   Must return after completion of pulmonary function tests  Return in about 6 weeks (around 09/05/2022) for with APP.  After completion of pulmonary function tests    Please do your part to reduce the spread of COVID-19.

## 2022-07-25 NOTE — Progress Notes (Signed)
Synopsis: Referred in October 2023 for emphysema by Holland Commons, FNP  Subjective:   PATIENT ID: Gina [name] GENDER: female DOB: 12-14-1960, MRN: [id number]  Chief Complaint  Patient presents with   Consult    This is a 61 year old female, past medical history of cataracts, gastric AVM, hiatal hernia, vitamin D deficiency, gastroesophageal reflux.  Patient is a current every day smoker at about a half a pack per day.Patient was referred evaluation of emphysema.  She is already enrolled in our lung cancer screening program and followed by Gina Barthel, NP.  Patient is lung cancer screening CT that was completed on 07/20/2022 was read as a lung RADS 2.  She does however have mild pulmonary artery enlargement as well as atherosclerosis of the coronary arteries and evidence of emphysema on her CT.  She did have an echocardiogram in 08/14/2021 completed by Dr. Virgina Jock.  She had a normal ejection fraction and grade 1 diastolic dysfunction. She has emphysema on her ct. She has never had PFTs.  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   Hiatal hernia    Irritable bowel syndrome    Unspecified vitamin D deficiency      Family History  Problem Relation Age of Onset   Hypertension Mother    Diabetes Mother    Coronary artery disease Mother        CABG at age 26   Colon cancer Mother        50's   Heart attack Mother    Heart disease Mother    Colon polyps Sister    Diabetes Sister    Colon polyps Sister    Heart attack Brother    Diabetes Brother    Esophageal cancer Neg Hx    Rectal cancer Neg Hx    Stomach cancer Neg Hx      Past Surgical History:   Procedure Laterality Date   ABDOMINAL HYSTERECTOMY     ANAL RECTAL MANOMETRY N/A 09/16/2017   Procedure: ANO RECTAL MANOMETRY;  Surgeon: Mauri Pole, MD;  Location: [facility];  Service: Endoscopy;  Laterality: N/A;   COLONOSCOPY  multiple   ESOPHAGEAL DILATION     EYE SURGERY     GANGLION CYST EXCISION Left    ILEOSTOMY     ILEOSTOMY CLOSURE     RECTOVAGINAL FISTULA CLOSURE     SHOULDER SURGERY     SHOULDER SURGERY     UPPER GASTROINTESTINAL ENDOSCOPY      Social History   Socioeconomic History   Marital status: Widowed    Spouse name: Not on file   Number of children: 3   Years of education: Not on file   Highest education level: Not on file  Occupational History    Employer: [employer]  Tobacco Use   Smoking status: Every Day    Packs/day: 0.50    Years: 30.00    Total pack years: 15.00    Types: Cigarettes   Smokeless tobacco: Never   Tobacco comments:    Smokes a pack a day of cigarettes a week. 07/25/2022. [name]  Vaping Use   Vaping Use: Former  Substance and Sexual Activity  Alcohol use: Yes    Comment: occ.   Drug use: No   Sexual activity: Yes    Birth control/protection: Surgical  Other Topics Concern   Not on file  Social History Narrative   Married works at [employer] - retired    3 children grown   Social alcohol use no substances or tobacco      Social Determinants of Radio broadcast assistant Strain: Not on file  Food Insecurity: Not on file  Transportation Needs: Not on file  Physical Activity: Not on file  Stress: Not on file  Social Connections: Not on file  Intimate Partner Violence: Not on file     Allergies  Allergen Reactions   Sulfa Antibiotics Other (See Comments)    unknown     Outpatient Medications Prior to Visit  Medication Sig Dispense Refill   fluticasone (FLONASE) 50 MCG/ACT nasal spray Place 2 sprays into the nose as needed. Reported on 12/20/2015 (Patient not taking: Reported on  07/25/2022)     ibuprofen (ADVIL,MOTRIN) 600 MG tablet Take 1 tablet (600 mg total) by mouth every 6 (six) hours as needed. 30 tablet 0   PROAIR HFA 108 (90 Base) MCG/ACT inhaler  (Patient not taking: Reported on 07/25/2022)     rosuvastatin (CRESTOR) 40 MG tablet TAKE 1 TABLET(40 MG) BY MOUTH DAILY 90 tablet 3   VITAMIN D PO Take by mouth daily. (Patient not taking: Reported on 06/08/2022)     Facility-Administered Medications Prior to Visit  Medication Dose Route Frequency Provider Last Rate Last Admin   gadopentetate dimeglumine (MAGNEVIST) injection 20 mL  20 mL Intravenous Once PRN Star Age, MD        Review of Systems  Constitutional:  Negative for chills, fever, malaise/fatigue and weight loss.  HENT:  Negative for hearing loss, sore throat and tinnitus.   Eyes:  Negative for blurred vision and double vision.  Respiratory:  Positive for cough, sputum production and shortness of breath. Negative for hemoptysis, wheezing and stridor.   Cardiovascular:  Negative for chest pain, palpitations, orthopnea, leg swelling and PND.  Gastrointestinal:  Negative for abdominal pain, constipation, diarrhea, heartburn, nausea and vomiting.  Genitourinary:  Negative for dysuria, hematuria and urgency.  Musculoskeletal:  Negative for joint pain and myalgias.  Skin:  Negative for itching and rash.  Neurological:  Negative for dizziness, tingling, weakness and headaches.  Endo/Heme/Allergies:  Negative for environmental allergies. Does not bruise/bleed easily.  Psychiatric/Behavioral:  Negative for depression. The patient is not nervous/anxious and does not have insomnia.   All other systems reviewed and are negative.    Objective:  Physical Exam Vitals reviewed.  Constitutional:      General: She is not in acute distress.    Appearance: She is well-developed.  HENT:     Head: Normocephalic and atraumatic.  Eyes:     General: No scleral icterus.    Conjunctiva/sclera: Conjunctivae normal.      Pupils: Pupils are equal, round, and reactive to light.  Neck:     Vascular: No JVD.     Trachea: No tracheal deviation.  Cardiovascular:     Rate and Rhythm: Normal rate and regular rhythm.     Heart sounds: Normal heart sounds. No murmur heard. Pulmonary:     Effort: Pulmonary effort is normal. No tachypnea, accessory muscle usage or respiratory distress.     Breath sounds: No stridor. No wheezing, rhonchi or rales.  Abdominal:     General: There is no  distension.     Palpations: Abdomen is soft.     Tenderness: There is no abdominal tenderness.  Musculoskeletal:        General: No tenderness.     Cervical back: Neck supple.  Lymphadenopathy:     Cervical: No cervical adenopathy.  Skin:    General: Skin is warm and dry.     Capillary Refill: Capillary refill takes less than 2 seconds.     Findings: No rash.  Neurological:     Mental Status: She is alert and oriented to person, place, and time.  Psychiatric:        Behavior: Behavior normal.      Vitals:   07/25/22 0909  BP: 110/70  Pulse: 90  SpO2: 93%  Weight: 236 lb (107 kg)  Height: '5\' 8"'$  (1.727 m)   93% on RA BMI Readings from Last 3 Encounters:  07/25/22 35.88 kg/m  06/29/22 34.85 kg/m  06/08/22 34.85 kg/m   Wt Readings from Last 3 Encounters:  07/25/22 236 lb (107 kg)  06/29/22 236 lb (107 kg)  06/08/22 236 lb (107 kg)     CBC    Component Value Date/Time   WBC 8.5 11/16/2020 1503   RBC 4.62 11/16/2020 1503   HGB 14.4 11/16/2020 1503   HCT 43.3 11/16/2020 1503   PLT 257 11/16/2020 1503   MCV 93.7 11/16/2020 1503   MCH 31.2 11/16/2020 1503   MCHC 33.3 11/16/2020 1503   RDW 14.6 11/16/2020 1503   LYMPHSABS 4.3 (H) 11/16/2020 1503   MONOABS 0.5 11/16/2020 1503   EOSABS 0.1 11/16/2020 1503   BASOSABS 0.0 11/16/2020 1503     Chest Imaging:  Lung cancer screening ct/ Sept 2023 Lung rads 2, small nodule stable. Emphysema  The patient's images have been independently reviewed by me.     Pulmonary Functions Testing Results:     No data to display          FeNO:   Pathology:   Echocardiogram:   Heart Catheterization:     Assessment & Plan:     ICD-10-CM   1. Centrilobular emphysema ([place])  J43.2     2. Multiple pulmonary nodules  R91.8       Discussion:  This is a 61 year old female, 1 pack/day smoker.  Currently rolled her lung cancer screening program.  Recent lung cancer screening CT lung RADS 2, benign appearance with evidence of emphysema.  She was referred here for evaluation of her emphysema.  Plan: Patient likely has underlying COPD.  No prior PFTs I have ordered PFTs to be complete prior to next office visit. New prescription for albuterol New prescription for Breztri Repeat lung cancer screening CT 1 year from now. RTC in 6 weeks after PFTs complete.   Current Outpatient Medications:    fluticasone (FLONASE) 50 MCG/ACT nasal spray, Place 2 sprays into the nose as needed. Reported on 12/20/2015 (Patient not taking: Reported on 07/25/2022), Disp: , Rfl:    ibuprofen (ADVIL,MOTRIN) 600 MG tablet, Take 1 tablet (600 mg total) by mouth every 6 (six) hours as needed., Disp: 30 tablet, Rfl: 0   PROAIR HFA 108 (90 Base) MCG/ACT inhaler, , Disp: , Rfl:    rosuvastatin (CRESTOR) 40 MG tablet, TAKE 1 TABLET(40 MG) BY MOUTH DAILY, Disp: 90 tablet, Rfl: 3   VITAMIN D PO, Take by mouth daily. (Patient not taking: Reported on 06/08/2022), Disp: , Rfl:  No current facility-administered medications for this visit.  Facility-Administered Medications Ordered in Other Visits:  gadopentetate dimeglumine (MAGNEVIST) injection 20 mL, 20 mL, Intravenous, Once PRN, Star Age, MD    Garner Nash, DO [facility] 07/25/2022 9:35 AM

## 2022-07-31 ENCOUNTER — Ambulatory Visit
Admission: RE | Admit: 2022-07-31 | Discharge: 2022-07-31 | Disposition: A | Discharge status: Home / Self Care | Payer: 59 | Source: Ambulatory Visit | Attending: Internal Medicine | Admitting: Internal Medicine

## 2022-07-31 DIAGNOSIS — Z1231 Encounter for screening mammogram for malignant neoplasm of breast: Secondary | ICD-10-CM

## 2022-08-03 ENCOUNTER — Ambulatory Visit: Payer: 59 | Admitting: Cardiology

## 2022-08-03 ENCOUNTER — Encounter: Payer: Self-pay | Admitting: Cardiology

## 2022-08-03 VITALS — BP 129/66 | HR 88 | Temp 98.0°F | Resp 16 | Ht 68.0 in | Wt 237.0 lb

## 2022-08-03 DIAGNOSIS — I2510 Atherosclerotic heart disease of native coronary artery without angina pectoris: Secondary | ICD-10-CM

## 2022-08-03 NOTE — Progress Notes (Signed)
Patient referred by Deland Pretty, MD for coronary calcification  Subjective:   Gina [name], female    DOB: [birth year], 61 y.o.   MRN: [id number]   Chief Complaint  Patient presents with   Coronary Artery Disease   Follow-up    1 year    HPI  61 y.o. American female with coronary atherosclerosis  Cancer screening annual CT scan again showed coronary and aorta atherosclerosis. She denies chest pain, shortness of breath, palpitations, leg edema, orthopnea, PND, TIA/syncope.   Office note 07/2020: Patient was seen by me in 08/2019 for similar findings of coronary atherosclerosis on lung cancer screening CT chest. At that time, she was walking 15,000 steps/year while at work. Now that she is retired, her walking has gone down. She has noticed retrosternal chest tightness, mostly at rest. She has stable, unchanged mild exertional dyspnea. Unfortunately, she continues to smoke 1 PPD. She is using nicotine patches to try to quit.   Since then, patient underwent exercise nuclear stress test that showed low exercise capacity, but no ischemia.  She is doing well.  She denies any chest pain or shortness of breath symptoms.  She has had swelling in her left leg.  She has not had ultrasound of her left leg in a long time.  Reviewed recent CT chest, and lab results with the patient, details below.   Current Outpatient Medications:    albuterol (VENTOLIN HFA) 108 (90 Base) MCG/ACT inhaler, Inhale 2 puffs into the lungs every 6 (six) hours as needed for wheezing or shortness of breath., Disp: 8 g, Rfl: 6   Budeson-Glycopyrrol-Formoterol (BREZTRI AEROSPHERE) 160-9-4.8 MCG/ACT AERO, Inhale 2 puffs into the lungs in the morning and at bedtime., Disp: 10.7 g, Rfl: 6   fluticasone (FLONASE) 50 MCG/ACT nasal spray, Place 2 sprays into the nose as needed. Reported on 12/20/2015 (Patient not taking: Reported on 07/25/2022), Disp: , Rfl:    ibuprofen (ADVIL,MOTRIN) 600 MG tablet, Take 1 tablet  (600 mg total) by mouth every 6 (six) hours as needed., Disp: 30 tablet, Rfl: 0   nicotine (NICODERM CQ - DOSED IN MG/24 HOURS) 14 mg/24hr patch, Place 1 patch (14 mg total) onto the skin daily., Disp: 28 patch, Rfl: 3   PROAIR HFA 108 (90 Base) MCG/ACT inhaler, , Disp: , Rfl:    rosuvastatin (CRESTOR) 40 MG tablet, TAKE 1 TABLET(40 MG) BY MOUTH DAILY, Disp: 90 tablet, Rfl: 3   VITAMIN D PO, Take by mouth daily. (Patient not taking: Reported on 06/08/2022), Disp: , Rfl:  No current facility-administered medications for this visit.  Facility-Administered Medications Ordered in Other Visits:    gadopentetate dimeglumine (MAGNEVIST) injection 20 mL, 20 mL, Intravenous, Once PRN, Star Age, MD    Cardiovascular studies:  EKG 08/03/2022: Sinus rhythm 90 bpm  Left atrial enlargement  [name] Extremity Venous Duplex  08/14/2021:  No evidence of deep vein thrombosis of the [name] extremities with normal  venous return.  Echocardiogram 08/14/2021:  Left ventricle cavity is normal in size. Mild concentric hypertrophy of  the left ventricle. Normal global wall motion. Normal LV systolic function  with EF 71%. Doppler evidence of grade I (impaired) diastolic dysfunction,  normal LAP. No significant valvular abnormality.  Normal right atrial pressure.   Exercise tetrofosmin stress test  08/15/2020: Normal ECG stress. The patient exercised for 4 minutes and 39 seconds of a Bruce protocol, achieving approximately 6.6 METs.  Normal BP response. Accelerated heart rate response suggests aerobic intolerance. Myocardial perfusion is normal. Overall  LV systolic function is normal without regional wall motion abnormalities. Stress LV EF: 76%.  No previous exam available for comparison. Low risk.   CT chest 07/20/2021: 1. Lung-RADS 2S, benign appearance or behavior. Continue annual screening with low-dose chest CT without contrast in 12 months. 2. The "S" modifier above refers to potentially  clinically significant non lung cancer related findings. Specifically, there is aortic atherosclerosis, in addition to two vessel coronary artery disease. Please note that although the presence of coronary artery calcium documents the presence of coronary artery disease, the severity of this disease and any potential stenosis cannot be assessed on this non-gated CT examination. Assessment for potential risk factor modification, dietary therapy or pharmacologic therapy may be warranted, if clinically indicated. 3. Mild diffuse bronchial wall thickening with mild centrilobular and paraseptal emphysema; imaging findings suggestive of underlying COPD. 4. Old granulomatous disease, as above.   Aortic Atherosclerosis (ICD10-I70.0) and Emphysema (ICD10-J43.9).  ABI 08/31/2019: This exam reveals normal perfusion of the right [name] extremity (ABI  1.35). This exam reveals normal perfusion of the left [name] extremity (ABI  1.35).  Mildly abnormal biphasic waveforms at the bilateral ankle may suggest mild  diffuse disease. Clinical correlation recommended.  Recent labs: 06/27/2021: Chol 181, TG 61, HDL 64, LDL 55  11/16/2020: Glucose 122, BUN/Cr 13/0.66. EGFR >60. Na/K 138/3.4. Rest of the CMP normal H/H 14/43. MCV 93. Platelets 257  06/23/2020: Glucose 87, BUN/Cr 16/0.68. EGFR normal. Na/K 140/4.7. Rest of the CMP normal H/H 15/45. MCV 90. Platelets 292 Chol 159, TG 67, HDL 59, LDL 87  06/15/2019: Glucose 80.  BUN/creatinine 17/0.78.  eGFR normal.  Sodium 143, potassium 5.3.  Rest of the CMP normal. H/H 14/44.  MCV 90.  Platelets 304. Cholesterol 185, TG 49, HDL 51, LDL 124   Review of Systems  Cardiovascular:  Negative for chest pain, claudication, dyspnea on exertion, leg swelling, palpitations and syncope.         Vitals:   08/03/22 0940  BP: 129/66  Pulse: 88  Resp: 16  Temp: 98 F (36.7 C)  SpO2: 95%    Body mass index is 36.04 kg/m. Filed Weights   08/03/22 0940   Weight: 237 lb (107.5 kg)     Objective:   Physical Exam Vitals and nursing note reviewed.  Constitutional:      General: She is not in acute distress. Neck:     Vascular: No JVD.  Cardiovascular:     Rate and Rhythm: Normal rate and regular rhythm.     Heart sounds: Normal heart sounds. No murmur heard.    Comments: B/l LE varicocities Pulmonary:     Effort: Pulmonary effort is normal.     Breath sounds: Normal breath sounds. No wheezing or rales.  Musculoskeletal:     Right [name] leg: No edema.     Left [name] leg: No edema.         Assessment & Recommendations:   61 y.o. American female with coronary atherosclerosis, tobacco dependance  Coronary atherosclerosis: No angina symptoms. No ischemia on stress testing in 2021. Reasonable to omit aspirin. Continue Crestor 40 mg daily.  Lipids very well controlled. Continue current antihypertensive therapy.  Blood pressure well controlled. Encouraged heart healthy diet and lifestyle, no smoking.   F/u in 1 year   Nigel Mormon, MD Pager: [phone] Office: [phone]

## 2022-08-10 ENCOUNTER — Other Ambulatory Visit: Payer: Self-pay | Admitting: Registered Nurse

## 2022-08-10 DIAGNOSIS — R911 Solitary pulmonary nodule: Secondary | ICD-10-CM

## 2022-09-07 ENCOUNTER — Ambulatory Visit: Payer: 59 | Admitting: Adult Health

## 2022-09-07 ENCOUNTER — Ambulatory Visit (INDEPENDENT_AMBULATORY_CARE_PROVIDER_SITE_OTHER): Payer: 59 | Admitting: Pulmonary Disease

## 2022-09-07 ENCOUNTER — Encounter: Payer: Self-pay | Admitting: Adult Health

## 2022-09-07 VITALS — BP 120/70 | HR 105 | Temp 97.9°F | Ht 68.5 in | Wt 234.2 lb

## 2022-09-07 DIAGNOSIS — J432 Centrilobular emphysema: Secondary | ICD-10-CM

## 2022-09-07 DIAGNOSIS — F17200 Nicotine dependence, unspecified, uncomplicated: Secondary | ICD-10-CM | POA: Diagnosis not present

## 2022-09-07 DIAGNOSIS — Z23 Encounter for immunization: Secondary | ICD-10-CM

## 2022-09-07 DIAGNOSIS — J439 Emphysema, unspecified: Secondary | ICD-10-CM | POA: Insufficient documentation

## 2022-09-07 DIAGNOSIS — R918 Other nonspecific abnormal finding of lung field: Secondary | ICD-10-CM

## 2022-09-07 LAB — PULMONARY FUNCTION TEST
DL/VA % pred: 96 %
DL/VA: 3.92 ml/min/mmHg/L
DLCO cor % pred: 70 %
DLCO cor: 16.3 ml/min/mmHg
DLCO unc % pred: 70 %
DLCO unc: 16.3 ml/min/mmHg
FEF 25-75 Post: 1.37 L/sec
FEF 25-75 Pre: 1.11 L/sec
FEF2575-%Change-Post: 23 %
FEF2575-%Pred-Post: 54 %
FEF2575-%Pred-Pre: 43 %
FEV1-%Change-Post: 5 %
FEV1-%Pred-Post: 58 %
FEV1-%Pred-Pre: 55 %
FEV1-Post: 1.72 L
FEV1-Pre: 1.63 L
FEV1FVC-%Change-Post: 0 %
FEV1FVC-%Pred-Pre: 93 %
FEV6-%Change-Post: 5 %
FEV6-%Pred-Post: 64 %
FEV6-%Pred-Pre: 60 %
FEV6-Post: 2.36 L
FEV6-Pre: 2.24 L
FEV6FVC-%Pred-Post: 103 %
FEV6FVC-%Pred-Pre: 103 %
FVC-%Change-Post: 5 %
FVC-%Pred-Post: 61 %
FVC-%Pred-Pre: 58 %
FVC-Post: 2.36 L
FVC-Pre: 2.24 L
Post FEV1/FVC ratio: 73 %
Post FEV6/FVC ratio: 100 %
Pre FEV1/FVC ratio: 73 %
Pre FEV6/FVC Ratio: 100 %
RV % pred: 108 %
RV: 2.45 L
TLC % pred: 86 %
TLC: 4.94 L

## 2022-09-07 NOTE — Assessment & Plan Note (Signed)
Emphysema-PFT showed moderate restriction.  We discussed her PFTs in detail.  Patient education was given.  Patient will rechallenge with Breztri.  We did discuss de-escalating down to Spiriva but she says she is already got this inhaler would like to give it a try. We will add a spacer to see if this will help. Continue with lung cancer screening program.  Encouraged on smoking cessation Check alpha-1.  Plan  Patient Instructions  Restart BREZTRI 2 puffs Twice daily  , may use with spacer, rinse after use  Mucinex DM Twice daily  As needed  cough/congestion  Activity as tolerated  Flu shot today  Quit smoking . Alpha 1 lab test today  Follow up with Dr. Valeta Harms or Haidar Muse NP in 3 months and As needed   Please contact office for sooner follow up if symptoms do not improve or worsen or seek emergency care

## 2022-09-07 NOTE — Progress Notes (Signed)
PFT done today. 

## 2022-09-07 NOTE — Patient Instructions (Addendum)
Restart BREZTRI 2 puffs Twice daily  , may use with spacer, rinse after use  Mucinex DM Twice daily  As needed  cough/congestion  Activity as tolerated  Flu shot today  Quit smoking . Alpha 1 lab test today  Follow up with Dr. Valeta Harms or Anntionette Madkins NP in 3 months and As needed   Please contact office for sooner follow up if symptoms do not improve or worsen or seek emergency care

## 2022-09-07 NOTE — Progress Notes (Signed)
$'@Patient'B$  ID: Gina [name], female    DOB: 1961/06/21, 61 y.o.   MRN: [id number]  Chief Complaint  Patient presents with   Follow-up    Referring provider: Deland Pretty, MD  HPI: 61 year old female active smoker seen for pulmonary consult July 25, 2022 for emphysema Participates in the lung cancer screening program   TEST/EVENTS :  CT chest July 20, 2022 mild emphysema largest noncalcified pulmonary nodule measuring 1.3 mm  09/07/2022 Follow up : Emphysema Patient presents for a 1 month follow-up.  Patient was seen last visit for pulmonary consult to establish for emphysema.  Patient is active smoker.  Discussed making cessation.  She was set up for pulmonary function testing that shows moderate restriction.  FEV1 at 58%, ratio 73, FVC 61%, DLCO 70%.  We discussed her PFT results in detail.  Last visit she was started on Breztri.  Since last visit she feels about the same.  She says she only tried breast tree a couple times felt like that may be a cause for cough to be able bit more.  Did not fully understand what the inhaler was supposed to do.  We discussed trying the inhaler with a spacer and she like to retry this. Wants flu shot today  Has daily cough , minimally productive. Has no significant dyspnea.  Retired. Active with house work.  Uses albuterol rarely.       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 09/07/2022 hfb   Ready to quit: Not Answered Counseling given: Not Answered Tobacco comments: Smokes a pack a day of cigarettes a week. 09/07/2022 hfb   Outpatient Medications Prior to Visit  Medication Sig Dispense Refill   albuterol (VENTOLIN HFA) 108 (90 Base) MCG/ACT inhaler Inhale 2 puffs into the lungs every 6 (six) hours as needed for wheezing or shortness of breath. 8 g 6   fluticasone (FLONASE) 50 MCG/ACT nasal spray Place 2 sprays into the nose as needed. Reported on 12/20/2015     ibuprofen (ADVIL,MOTRIN) 600 MG tablet Take 1 tablet (600 mg total) by mouth every 6 (six) hours as needed. 30 tablet 0   nicotine (NICODERM CQ - DOSED IN MG/24 HOURS) 14 mg/24hr patch Place 1 patch (14 mg total) onto the skin daily. 28 patch 3   rosuvastatin (CRESTOR) 40 MG tablet TAKE 1 TABLET(40 MG) BY MOUTH DAILY 90 tablet 3   VITAMIN D PO Take by mouth daily.     Budeson-Glycopyrrol-Formoterol (BREZTRI AEROSPHERE) 160-9-4.8 MCG/ACT AERO Inhale 2 puffs into the lungs in the morning and at bedtime. (Patient not taking: Reported on 09/07/2022) 10.7 g 6  Facility-Administered Medications Prior to Visit  Medication Dose Route Frequency Provider Last Rate Last Admin   gadopentetate dimeglumine (MAGNEVIST) injection 20 mL  20 mL Intravenous Once PRN Star Age, MD         Review of Systems:   Constitutional:   No  weight loss, night sweats,  Fevers, chills, fatigue, or  lassitude.  HEENT:   No headaches,  Difficulty swallowing,  Tooth/dental problems, or  Sore throat,                No sneezing, itching, ear ache, nasal congestion, post nasal drip,   CV:  No chest pain,  Orthopnea, PND, swelling in [name] extremities, anasarca, dizziness,  palpitations, syncope.   GI  No heartburn, indigestion, abdominal pain, nausea, vomiting, diarrhea, change in bowel habits, loss of appetite, bloody stools.   Resp: No shortness of breath with exertion or at rest.  No excess mucus, no productive cough,  No non-productive cough,  No coughing up of blood.  No change in color of mucus.  No wheezing.  No chest wall deformity  Skin: no rash or lesions.  GU: no dysuria, change in color of urine, no urgency or frequency.  No flank pain, no hematuria   MS:  No joint pain or swelling.  No decreased range of motion.  No back pain.    Physical Exam  BP 120/70 (BP Location: Left Arm, Patient Position: Sitting, Cuff Size: Large)   Pulse (!) 105   Temp 97.9 F (36.6 C) (Oral)   Ht 5' 8.5" (1.74 m)   Wt 234 lb 3.2 oz (106.2 kg)   SpO2 96%   BMI 35.09 kg/m   GEN: A/Ox3; pleasant , NAD, well nourished    HEENT:  [place]/AT,  EACs-clear, TMs-wnl, NOSE-clear, THROAT-clear, no lesions, no postnasal drip or exudate noted.   NECK:  Supple w/ fair ROM; no JVD; normal carotid impulses w/o bruits; no thyromegaly or nodules palpated; no lymphadenopathy.    RESP  Clear  P & A; w/o, wheezes/ rales/ or rhonchi. no accessory muscle use, no dullness to percussion  CARD:  RRR, no m/r/g, no peripheral edema, pulses intact, no cyanosis or clubbing.  GI:   Soft & nt; nml bowel sounds; no organomegaly or masses detected.   Musco: Warm bil, no deformities or joint swelling noted.   Neuro: alert, no focal deficits noted.    Skin: Warm, no lesions or rashes    Lab Results:  CBC    Component Value Date/Time   WBC 8.5 11/16/2020 1503   RBC 4.62 11/16/2020 1503   HGB 14.4 11/16/2020 1503   HCT 43.3 11/16/2020 1503   PLT 257 11/16/2020 1503   MCV 93.7 11/16/2020 1503   MCH 31.2 11/16/2020 1503   MCHC 33.3 11/16/2020 1503   RDW 14.6 11/16/2020 1503   LYMPHSABS 4.3 (H) 11/16/2020 1503   MONOABS 0.5 11/16/2020 1503   EOSABS 0.1 11/16/2020 1503   BASOSABS  0.0 11/16/2020 1503    BMET    Component Value Date/Time   NA 138 11/16/2020 1503   K 3.4 (L) 11/16/2020 1503   CL 104 11/16/2020 1503   CO2 25 11/16/2020 1503   GLUCOSE 122 (H) 11/16/2020 1503   BUN 13 11/16/2020 1503   CREATININE 0.66 11/16/2020 1503   CALCIUM 9.0 11/16/2020 1503   GFRNONAA >60 11/16/2020 1503   GFRAA >90 04/13/2014 0759    BNP No results found for: "BNP"  ProBNP No results found for: "PROBNP"  Imaging:  No results found.       Latest Ref Rng & Units 09/07/2022    9:53 AM  PFT Results  FVC-Pre L 2.24  P  FVC-Predicted Pre % 58  P  FVC-Post L 2.36  P  FVC-Predicted Post % 61  P  Pre FEV1/FVC % % 73  P  Post FEV1/FCV % % 73  P  FEV1-Pre L 1.63  P  FEV1-Predicted Pre % 55  P  FEV1-Post L 1.72  P  DLCO uncorrected ml/min/mmHg 16.30  P  DLCO UNC% % 70  P  DLCO corrected ml/min/mmHg 16.30  P  DLCO COR %Predicted % 70  P  DLVA Predicted % 96  P  TLC L 4.94  P  TLC % Predicted % 86  P  RV % Predicted % 108  P    P Preliminary result    No results found for: "NITRICOXIDE"      Assessment & Plan:   Emphysema lung (HCC) Emphysema-PFT showed moderate restriction.  We discussed her PFTs in detail.  Patient education was given.  Patient will rechallenge with Breztri.  We did discuss de-escalating down to Spiriva but she says she is already got this inhaler would like to give it a try. We will add a spacer to see if this will help. Continue with lung cancer screening program.  Encouraged on smoking cessation Check alpha-1.  Plan  Patient Instructions  Restart BREZTRI 2 puffs Twice daily  , may use with spacer, rinse after use  Mucinex DM Twice daily  As needed  cough/congestion  Activity as tolerated  Flu shot today  Quit smoking . Alpha 1 lab test today  Follow up with Dr. Valeta Harms or Shaylyn Bawa NP in 3 months and As needed   Please contact office for sooner follow up if symptoms do not improve or worsen or seek emergency care        Tobacco dependence Smoking cessation discussed in detail     Rexene Edison, NP 09/07/2022

## 2022-09-07 NOTE — Assessment & Plan Note (Signed)
Smoking cessation discussed in detail 

## 2022-09-19 ENCOUNTER — Other Ambulatory Visit: Payer: Self-pay | Admitting: Cardiology

## 2022-09-19 DIAGNOSIS — I2510 Atherosclerotic heart disease of native coronary artery without angina pectoris: Secondary | ICD-10-CM

## 2022-09-19 DIAGNOSIS — E782 Mixed hyperlipidemia: Secondary | ICD-10-CM

## 2022-09-21 LAB — ALPHA-1 ANTITRYPSIN PHENOTYPE: A-1 Antitrypsin, Ser: 123 mg/dL (ref 83–199)

## 2022-12-06 ENCOUNTER — Other Ambulatory Visit: Payer: Self-pay

## 2022-12-06 DIAGNOSIS — I2510 Atherosclerotic heart disease of native coronary artery without angina pectoris: Secondary | ICD-10-CM

## 2022-12-06 DIAGNOSIS — E782 Mixed hyperlipidemia: Secondary | ICD-10-CM

## 2022-12-06 MED ORDER — ROSUVASTATIN CALCIUM 40 MG PO TABS: ORAL_TABLET | ORAL | 3 refills | Status: AC

## 2022-12-10 ENCOUNTER — Ambulatory Visit: Payer: 59 | Admitting: Adult Health

## 2022-12-13 ENCOUNTER — Ambulatory Visit: Payer: 59 | Admitting: Adult Health

## 2022-12-13 ENCOUNTER — Encounter: Payer: Self-pay | Admitting: Adult Health

## 2022-12-13 ENCOUNTER — Ambulatory Visit: Payer: 59 | Admitting: Cardiology

## 2022-12-13 VITALS — BP 112/60 | HR 91 | Temp 98.3°F | Ht 68.0 in | Wt 237.4 lb

## 2022-12-13 DIAGNOSIS — J439 Emphysema, unspecified: Secondary | ICD-10-CM

## 2022-12-13 DIAGNOSIS — F17210 Nicotine dependence, cigarettes, uncomplicated: Secondary | ICD-10-CM

## 2022-12-13 DIAGNOSIS — F17200 Nicotine dependence, unspecified, uncomplicated: Secondary | ICD-10-CM

## 2022-12-13 NOTE — Assessment & Plan Note (Signed)
Emphysema with moderate restriction on PFTs improved symptom control on Breztri.  Alpha-1 testing was normal continue on current maintenance regimen.  Continue with yearly lung cancer screening CT program.  Declines RSV and pneumonia vaccine.  Patient education was given  Plan  Patient Instructions  BREZTRI 2 puffs Twice daily  , may use with spacer, rinse after use  Mucinex DM Twice daily  As needed  cough/congestion  Activity as tolerated  Work on quitting smoking  Follow up with Dr. Valeta Harms or Aneisha Skyles NP in 6 months and As needed   Please contact office for sooner follow up if symptoms do not improve or worsen or seek emergency care

## 2022-12-13 NOTE — Patient Instructions (Signed)
BREZTRI 2 puffs Twice daily  , may use with spacer, rinse after use  Mucinex DM Twice daily  As needed  cough/congestion  Activity as tolerated  Work on quitting smoking  Follow up with Dr. Valeta Harms or Thomson Herbers NP in 6 months and As needed   Please contact office for sooner follow up if symptoms do not improve or worsen or seek emergency care

## 2022-12-13 NOTE — Assessment & Plan Note (Signed)
Smoking cessation discussed in detail 

## 2022-12-13 NOTE — Progress Notes (Signed)
$@Patientl$  ID: Gina [name], female    DOB: 10-05-1961, 62 y.o.   MRN: [id number]  Chief Complaint  Patient presents with   Follow-up    Referring provider: Deland Pretty, MD  HPI: 62 year old female active smoker seen for pulmonary consult July 25, 2022 for emphysema. Participates in the lung cancer screening program  TEST/EVENTS :  Alpha-1 MM, 123  09/07/22 Pulmonary function testing that shows moderate restriction. FEV1 at 58%, ratio 73, FVC 61%, DLCO 70%   CT chest July 20, 2022 shows mild emphysema, calcified and noncalcified scattered nodules with largest measuring measuring 1.3 mm,(Lung RADS 2)     12/13/2022 Follow up: Emphysema  Patient presents for a 55-monthfollow-up.  Patient was seen in October to establish for emphysema.  PFT showed moderate restriction.  Patient was recommended to start back on  Breztri.  Since last visit patient is feeling okay.  She gets short of breath with heavy activities.  Has intermittent cough and congestion.  She denies any flare of wheezing.  She remains on Breztri twice daily.  Does feel like it helps her breathing.  She denies any hemoptysis, weight loss, edema. We discussed pneumonia and RSV vaccine.  Patient declines at this time.  Patient education was given  Patient is retired.  She is active at home with her housework.  Got gym membership, going to start next week. Rarely uses albuterol. She is trying to quit smoking.  We discussed smoking cessation in detail.     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 Trying to quit.  12/13/2022 hfb   Ready to quit: No Counseling given: Yes Tobacco comments: Smokes a pack a day of cigarettes. Trying to quit.  12/13/2022 hfb   Outpatient Medications Prior to Visit  Medication Sig Dispense Refill   albuterol (VENTOLIN HFA) 108 (90 Base) MCG/ACT inhaler Inhale 2 puffs into the lungs every 6 (six) hours as needed for wheezing or shortness of breath. 8 g 6   fluticasone (FLONASE) 50 MCG/ACT nasal spray Place 2 sprays into the nose as needed. Reported on 12/20/2015     ibuprofen (ADVIL,MOTRIN) 600 MG tablet Take 1 tablet (600 mg total) by mouth every 6 (six) hours as needed. 30 tablet 0   rosuvastatin (CRESTOR) 40 MG tablet TAKE 1 TABLET(40 MG) BY MOUTH DAILY 90 tablet 3   VITAMIN D PO Take by mouth daily.     nicotine (NICODERM CQ - DOSED IN MG/24 HOURS) 14 mg/24hr patch Place 1 patch (14 mg total) onto the skin daily. (Patient not taking: Reported on 12/13/2022) 28 patch 3  Facility-Administered Medications Prior to Visit  Medication Dose Route Frequency Provider Last Rate Last Admin   gadopentetate dimeglumine (MAGNEVIST) injection 20 mL  20 mL Intravenous Once PRN Star Age, MD         Review of Systems:   Constitutional:   No  weight loss, night sweats,  Fevers, chills, + fatigue, or  lassitude.  HEENT:   No headaches,  Difficulty swallowing,  Tooth/dental problems, or  Sore throat,                No sneezing, itching, ear ache, nasal congestion, post nasal drip,   CV:  No chest pain,  Orthopnea, PND, swelling in [name]  extremities, anasarca, dizziness, palpitations, syncope.   GI  No heartburn, indigestion, abdominal pain, nausea, vomiting, diarrhea, change in bowel habits, loss of appetite, bloody stools.   Resp:   No coughing up of blood.  No change in color of mucus.  No wheezing.  No chest wall deformity  Skin: no rash or lesions.  GU: no dysuria, change in color of urine, no urgency or frequency.  No flank pain, no hematuria   MS:  No joint pain or swelling.  No decreased range of motion.  No back pain.    Physical Exam  BP 112/60 (BP Location: Left Arm, Patient Position: Sitting, Cuff Size: Large)   Pulse 91   Temp 98.3 F (36.8 C) (Oral)   Ht 5' 8"$  (1.727 m)   Wt 237 lb 6.4 oz (107.7 kg)   SpO2 100%   BMI 36.10 kg/m   GEN: A/Ox3; pleasant , NAD, well nourished    HEENT:  [place]/AT,   NOSE-clear, THROAT-clear, no lesions, no postnasal drip or exudate noted.   NECK:  Supple w/ fair ROM; no JVD; normal carotid impulses w/o bruits; no thyromegaly or nodules palpated; no lymphadenopathy.    RESP  Clear  P & A; w/o, wheezes/ rales/ or rhonchi. no accessory muscle use, no dullness to percussion  CARD:  RRR, no m/r/g, no peripheral edema, pulses intact, no cyanosis or clubbing.  GI:   Soft & nt; nml bowel sounds; no organomegaly or masses detected.   Musco: Warm bil, no deformities or joint swelling noted.   Neuro: alert, no focal deficits noted.    Skin: Warm, no lesions or rashes    Lab Results:  CBC    BNP No results found for: "BNP"  ProBNP No results found for: "PROBNP"  Imaging: No results found.       Latest Ref Rng & Units 09/07/2022    9:53 AM  PFT Results  FVC-Pre L 2.24   FVC-Predicted Pre % 58   FVC-Post L 2.36   FVC-Predicted Post % 61   Pre FEV1/FVC % % 73   Post FEV1/FCV % % 73   FEV1-Pre L 1.63   FEV1-Predicted Pre % 55   FEV1-Post L 1.72   DLCO uncorrected ml/min/mmHg 16.30   DLCO UNC% % 70   DLCO corrected ml/min/mmHg 16.30   DLCO COR  %Predicted % 70   DLVA Predicted % 96   TLC L 4.94   TLC % Predicted % 86   RV % Predicted % 108     No results found for: "NITRICOXIDE"      Assessment & Plan:   Emphysema lung (HCC) Emphysema with moderate restriction on PFTs improved symptom control on Breztri.  Alpha-1 testing was normal continue on current maintenance regimen.  Continue with yearly lung cancer screening CT program.  Declines RSV and pneumonia vaccine.  Patient education was given  Plan  Patient Instructions  BREZTRI 2 puffs Twice daily  , may use with spacer, rinse after use  Mucinex DM Twice daily  As needed  cough/congestion  Activity as tolerated  Work on quitting smoking  Follow up with Dr. Valeta Harms or Nikolis Berent NP in 6 months and As needed   Please contact office for sooner follow up if symptoms do not improve or worsen or seek emergency care       Tobacco dependence Smoking cessation discussed in detail.     Rexene Edison, NP 12/13/2022

## 2023-05-30 ENCOUNTER — Ambulatory Visit (INDEPENDENT_AMBULATORY_CARE_PROVIDER_SITE_OTHER): Payer: 59

## 2023-05-30 ENCOUNTER — Ambulatory Visit: Payer: 59 | Admitting: Podiatry

## 2023-05-30 DIAGNOSIS — M778 Other enthesopathies, not elsewhere classified: Secondary | ICD-10-CM | POA: Diagnosis not present

## 2023-05-30 DIAGNOSIS — L84 Corns and callosities: Secondary | ICD-10-CM

## 2023-05-30 DIAGNOSIS — M79672 Pain in left foot: Secondary | ICD-10-CM

## 2023-05-30 DIAGNOSIS — M79671 Pain in right foot: Secondary | ICD-10-CM

## 2023-05-30 NOTE — Patient Instructions (Signed)

## 2023-05-30 NOTE — Progress Notes (Signed)
Subjective:   Patient ID: Gina [name], female   DOB: 62 y.o.   MRN: [id number]   HPI Chief Complaint  Patient presents with   Foot Pain    Pain in both feet. That started about 5 years she sometimes get shots in her feet to ease the pain.   Callouses    62 year old female presents the office with above concerns.  Both are about the same. Sometimes she will get pain to get feet, even at night on the left. No recent treatment, it has been a couple of years. She would keep the calluses trimmed. She has had several types of inserts.  No open lesions.   Review of Systems  All other systems reviewed and are negative.  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   Hiatal hernia    Irritable bowel syndrome    Unspecified vitamin D deficiency     Past Surgical History:  Procedure Laterality Date   ABDOMINAL HYSTERECTOMY     ANAL RECTAL MANOMETRY N/A 09/16/2017   Procedure: ANO RECTAL MANOMETRY;  Surgeon: Napoleon Form, MD;  Location: [facility];  Service: Endoscopy;  Laterality: N/A;   COLONOSCOPY  multiple   ESOPHAGEAL DILATION     EYE SURGERY     GANGLION CYST EXCISION Left    ILEOSTOMY     ILEOSTOMY CLOSURE     RECTOVAGINAL FISTULA CLOSURE     SHOULDER SURGERY     SHOULDER SURGERY     UPPER GASTROINTESTINAL ENDOSCOPY       Current Outpatient Medications:    albuterol (VENTOLIN HFA) 108 (90 Base) MCG/ACT inhaler, Inhale 2 puffs into the lungs every 6 (six) hours as needed for wheezing or shortness of breath., Disp: 8 g, Rfl: 6   fluticasone (FLONASE) 50 MCG/ACT nasal spray, Place 2 sprays into the nose as needed. Reported on 12/20/2015, Disp: , Rfl:    ibuprofen (ADVIL,MOTRIN) 600 MG tablet, Take 1 tablet (600  mg total) by mouth every 6 (six) hours as needed., Disp: 30 tablet, Rfl: 0   nicotine (NICODERM CQ - DOSED IN MG/24 HOURS) 14 mg/24hr patch, Place 1 patch (14 mg total) onto the skin daily. (Patient not taking: Reported on 12/13/2022), Disp: 28 patch, Rfl: 3   rosuvastatin (CRESTOR) 40 MG tablet, TAKE 1 TABLET(40 MG) BY MOUTH DAILY, Disp: 90 tablet, Rfl: 3   VITAMIN D PO, Take by mouth daily., Disp: , Rfl:  No current facility-administered medications for this visit.  Facility-Administered Medications Ordered in Other Visits:    gadopentetate dimeglumine (MAGNEVIST) injection 20 mL, 20 mL, Intravenous, Once PRN, Huston Foley, MD  Allergies  Allergen Reactions   Sulfa Antibiotics Other (See Comments)    unknown          Objective:  Physical Exam  General: AAO x3, NAD  Dermatological: Hyperkeratotic lesions bilateral submetatarsal 1 and 5 without any underlying ulceration drainage or any signs of infection.   Vascular: Dorsalis Pedis artery and Posterior Tibial artery pedal pulses are 2/4 bilateral with immedate capillary fill time.  There is no pain with calf compression, swelling, warmth, erythema.   Neruologic: Grossly intact via light touch bilateral.   Musculoskeletal: Prominence of metatarsal heads plantarly.  Tenderness to palpation to  the hyperkeratotic lesions but no other areas of discomfort.     Assessment:   62 year old female with hyperkeratotic lesions, capsulitis     Plan:  -Treatment options discussed including all alternatives, risks, and complications -Etiology of symptoms were discussed -X-rays were obtained and reviewed with the patient.  3 views bilateral feet were obtained.  No evidence of acute fracture.  Metatarsus adductus is noted.  No foreign body. -We discussed daily injection she was to proceed with this.  We discussed risks of repeated injections including patches of appetite.  She understands the risks and she wishes to proceed.  I cleaned the skin  with alcohol and a mixture of 0.5 cc assessment on possibly, 0.5 cc of Kenalog 10 and 1 cc of lidocaine plain was infiltrated bilaterally submetatarsal 1 and 5. -Sharply debrided hyperkeratotic lesions were any complications or bleeding -Discussed salicylic acid next appointment if needed.  Vivi Barrack DPM

## 2023-06-19 ENCOUNTER — Ambulatory Visit: Payer: 59 | Admitting: Adult Health

## 2023-06-19 ENCOUNTER — Encounter: Payer: Self-pay | Admitting: Adult Health

## 2023-06-19 VITALS — BP 118/62 | HR 89 | Temp 98.1°F | Ht 68.0 in | Wt 233.4 lb

## 2023-06-19 DIAGNOSIS — J439 Emphysema, unspecified: Secondary | ICD-10-CM

## 2023-06-19 DIAGNOSIS — F17200 Nicotine dependence, unspecified, uncomplicated: Secondary | ICD-10-CM | POA: Diagnosis not present

## 2023-06-19 MED ORDER — BREZTRI AEROSPHERE 160-9-4.8 MCG/ACT IN AERO: 2.0000 | INHALATION_SPRAY | Freq: Two times a day (BID) | RESPIRATORY_TRACT | 5 refills | Status: DC

## 2023-06-19 MED ORDER — ALBUTEROL SULFATE HFA 108 (90 BASE) MCG/ACT IN AERS: 1.0000 | INHALATION_SPRAY | Freq: Four times a day (QID) | RESPIRATORY_TRACT | 3 refills | Status: DC | PRN

## 2023-06-19 NOTE — Patient Instructions (Addendum)
BREZTRI 2 puffs Twice daily, rinse after use  Albuterol inhaler As needed   Mucinex DM Twice daily  As needed  cough/congestion  Activity as tolerated  Work on quitting smoking  Chest CT as planned next month.  Follow up with Dr. Tonia Brooms or Rhaelyn Giron NP in 6 months and As needed   Please contact office for sooner follow up if symptoms do not improve or worsen or seek emergency care

## 2023-06-19 NOTE — Progress Notes (Signed)
@Patient  ID: Gina [name], female    DOB: 16-Jun-1961, 62 y.o.   MRN: [id number]  Chief Complaint  Patient presents with   Follow-up    Referring provider: Merri Brunette, MD  HPI: 62 yo female active smoker seen for pulmonary consult 07/25/22 for COPD with Emphysema.  Participates in LDCT Chest Screening program.   TEST/EVENTS :  Alpha-1 MM, 123   09/07/22 Pulmonary function testing that shows moderate restriction. FEV1 at 58%, ratio 73, FVC 61%, DLCO 70%    CT chest July 20, 2022 shows mild emphysema, calcified and noncalcified scattered nodules with largest measuring measuring 1.3 mm,(Lung RADS 2)    06/19/2023 Follow up : COPD with Emphysema  Patient presents for a 71-month follow-up.  Patient has underlying COPD with emphysema.  Previous PFTs last year showed moderate restriction with FEV1 at 58% and FVC at 61%.  CT chest showed mild emphysema.  She is followed in the lung cancer CT screening program.  Has an upcoming CT due next month.  Patient remains on Breztri twice daily.  Does admit she does not take every day.  We discussed the pneumonia and flu vaccine.  Patient declines at this time.  Patient education was given.  She says overall she feels her breathing is doing okay.  She does not have much shortness of breath.  No flare of her cough or wheezing.  Patient does continue to smoke.  She is trying to cut back.  Has set up a quit date for later this month. Patient is active at home.  She does have a gym membership but has not started yet.  She is fully retired.  Is going on a cruise to [place] next week and is very excited.  All of her sisters will be there with her.   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 Trying to quit.  12/13/2022 hfb   Ready to quit: Not Answered Counseling given: Not Answered Tobacco comments: Smokes a pack a day of cigarettes. Trying to quit.  12/13/2022 hfb   Outpatient Medications Prior to Visit  Medication Sig Dispense Refill   fluticasone (FLONASE) 50 MCG/ACT nasal spray Place 2 sprays into the nose as needed. Reported on 12/20/2015     ibuprofen (ADVIL,MOTRIN) 600 MG tablet Take 1 tablet (600 mg total) by mouth every 6 (six) hours as needed. 30 tablet 0   rosuvastatin (CRESTOR) 40 MG tablet TAKE 1 TABLET(40 MG) BY MOUTH DAILY 90 tablet 3   VITAMIN D PO Take by mouth daily.     Budeson-Glycopyrrol-Formoterol (BREZTRI AEROSPHERE) 160-9-4.8 MCG/ACT AERO  Inhale into the lungs. Using as needed     nicotine (NICODERM CQ - DOSED IN MG/24 HOURS) 14 mg/24hr patch Place 1 patch (14 mg total) onto the skin daily. (Patient not taking: Reported on 06/19/2023) 28 patch 3   albuterol (VENTOLIN HFA) 108 (90 Base) MCG/ACT inhaler Inhale 2 puffs into the lungs every 6 (six) hours as needed for wheezing or shortness of breath. (Patient not taking: Reported on 06/19/2023) 8 g 6   Facility-Administered Medications Prior to Visit  Medication Dose Route Frequency Provider Last Rate Last Admin   gadopentetate dimeglumine (MAGNEVIST) injection 20 mL  20 mL Intravenous Once PRN Huston Foley, MD         Review of Systems:   Constitutional:   No  weight loss, night sweats,  Fevers, chills, fatigue, or  lassitude.  HEENT:   No headaches,  Difficulty swallowing,  Tooth/dental problems, or  Sore throat,                No sneezing, itching, ear ache, nasal congestion, post nasal drip,   CV:  No chest pain,  Orthopnea, PND, swelling in lower extremities, anasarca, dizziness, palpitations, syncope.   GI  No heartburn, indigestion, abdominal pain, nausea, vomiting, diarrhea, change in bowel habits, loss of appetite, bloody stools.   Resp: No shortness of breath with exertion or at rest.  No excess mucus, no productive cough,  No non-productive cough,  No coughing up of blood.  No change in color of mucus.  No wheezing.  No chest wall deformity  Skin: no rash or lesions.  GU: no dysuria, change in color of urine, no urgency or frequency.  No flank pain, no hematuria   MS:  No joint pain or swelling.  No decreased range of motion.  No back pain.    Physical Exam  BP 118/62 (BP Location: Left Arm, Cuff Size: Large)   Pulse 89   Temp 98.1 F (36.7 C) (Temporal)   Ht 5\' 8"  (1.727 m)   Wt 233 lb 6.4 oz (105.9 kg)   SpO2 100%   BMI 35.49 kg/m   GEN: A/Ox3; pleasant , NAD, well nourished    HEENT:  [place]/AT,  NOSE-clear, THROAT-clear, no lesions, no postnasal drip or exudate noted.   NECK:  Supple w/ fair ROM; no JVD; normal carotid impulses w/o bruits; no thyromegaly or nodules palpated; no lymphadenopathy.    RESP  Clear  P & A; w/o, wheezes/ rales/ or rhonchi. no accessory muscle use, no dullness to percussion  CARD:  RRR, no m/r/g, no peripheral edema, pulses intact, no cyanosis or clubbing.  GI:   Soft & nt; nml bowel sounds; no organomegaly or masses detected.   Musco: Warm bil, no deformities or joint swelling noted.   Neuro: alert, no focal deficits noted.    Skin: Warm, no lesions or rashes    Lab Results:  CBC    BNP No results found for:  "BNP"  ProBNP No results found for: "PROBNP"  Imaging: DG Foot 2 Views Right  Result Date: 05/30/2023 Please see detailed radiograph report in office note.  DG Foot 2 Views Left  Result Date: 05/30/2023 Please see detailed radiograph report in office note.   Administration History     None          Latest Ref Rng & Units 09/07/2022    9:53 AM  PFT Results  FVC-Pre L 2.24   FVC-Predicted Pre % 58   FVC-Post L 2.36  FVC-Predicted Post % 61   Pre FEV1/FVC % % 73   Post FEV1/FCV % % 73   FEV1-Pre L 1.63   FEV1-Predicted Pre % 55   FEV1-Post L 1.72   DLCO uncorrected ml/min/mmHg 16.30   DLCO UNC% % 70   DLCO corrected ml/min/mmHg 16.30   DLCO COR %Predicted % 70   DLVA Predicted % 96   TLC L 4.94   TLC % Predicted % 86   RV % Predicted % 108     No results found for: "NITRICOXIDE"      Assessment & Plan:   Emphysema lung (HCC) Emphysema with moderate restriction on pulmonary function testing.  Patient is encouraged on Breztri compliant.  She is to continue with the yearly lung cancer CT screening program.  Declines the pneumonia and flu shot at this time.  Says she will consider maybe later this fall.  We discussed smoking cessation.  Plan  Patient Instructions  BREZTRI 2 puffs Twice daily, rinse after use  Albuterol inhaler As needed   Mucinex DM Twice daily  As needed  cough/congestion  Activity as tolerated  Work on quitting smoking  Chest CT as planned next month.  Follow up with Dr. Tonia Brooms or Lamya Lausch NP in 6 months and As needed   Please contact office for sooner follow up if symptoms do not improve or worsen or seek emergency care     Tobacco dependence Smoking cessation discussed in detail     Rubye Oaks, NP 06/19/2023

## 2023-06-19 NOTE — Assessment & Plan Note (Addendum)
Smoking cessation discussed in detail Continue with the lung cancer CT screening program

## 2023-06-19 NOTE — Assessment & Plan Note (Signed)
Emphysema with moderate restriction on pulmonary function testing.  Patient is encouraged on Breztri compliant.  She is to continue with the yearly lung cancer CT screening program.  Declines the pneumonia and flu shot at this time.  Says she will consider maybe later this fall.  We discussed smoking cessation.  Plan  Patient Instructions  BREZTRI 2 puffs Twice daily, rinse after use  Albuterol inhaler As needed   Mucinex DM Twice daily  As needed  cough/congestion  Activity as tolerated  Work on quitting smoking  Chest CT as planned next month.  Follow up with Dr. Tonia Brooms or Delanna Blacketer NP in 6 months and As needed   Please contact office for sooner follow up if symptoms do not improve or worsen or seek emergency care

## 2023-07-17 LAB — LAB REPORT - SCANNED
A1c: 6.1
EGFR: 94

## 2023-07-22 ENCOUNTER — Ambulatory Visit
Admission: RE | Admit: 2023-07-22 | Discharge: 2023-07-22 | Disposition: A | Discharge status: Home / Self Care | Payer: 59 | Source: Ambulatory Visit | Attending: Acute Care | Admitting: Acute Care

## 2023-07-22 DIAGNOSIS — Z122 Encounter for screening for malignant neoplasm of respiratory organs: Secondary | ICD-10-CM

## 2023-07-22 DIAGNOSIS — Z87891 Personal history of nicotine dependence: Secondary | ICD-10-CM

## 2023-07-22 DIAGNOSIS — F17210 Nicotine dependence, cigarettes, uncomplicated: Secondary | ICD-10-CM

## 2023-08-05 ENCOUNTER — Ambulatory Visit: Payer: Self-pay | Admitting: Cardiology

## 2023-08-05 ENCOUNTER — Ambulatory Visit: Payer: 59 | Admitting: Cardiology

## 2023-08-06 ENCOUNTER — Encounter: Payer: Self-pay | Admitting: Cardiology

## 2023-08-06 ENCOUNTER — Ambulatory Visit: Payer: 59 | Attending: Cardiology | Admitting: Cardiology

## 2023-08-06 ENCOUNTER — Other Ambulatory Visit: Payer: Self-pay | Admitting: Internal Medicine

## 2023-08-06 VITALS — BP 116/72 | HR 82 | Ht 68.5 in | Wt 228.6 lb

## 2023-08-06 DIAGNOSIS — Z1231 Encounter for screening mammogram for malignant neoplasm of breast: Secondary | ICD-10-CM

## 2023-08-06 DIAGNOSIS — F17210 Nicotine dependence, cigarettes, uncomplicated: Secondary | ICD-10-CM

## 2023-08-06 DIAGNOSIS — I2510 Atherosclerotic heart disease of native coronary artery without angina pectoris: Secondary | ICD-10-CM | POA: Diagnosis not present

## 2023-08-06 MED ORDER — NICOTINE 14 MG/24HR TD PT24: MEDICATED_PATCH | TRANSDERMAL | 0 refills | Status: DC

## 2023-08-06 MED ORDER — NICOTINE 7 MG/24HR TD PT24: MEDICATED_PATCH | TRANSDERMAL | 0 refills | Status: DC

## 2023-08-06 NOTE — Progress Notes (Signed)
Cardiology Office Note:  .   Date:  08/06/2023  ID:  Gina [name], DOB October 22, 1961, MRN [id number] PCP: Gina Brunette, MD  [facility] Providers Cardiologist:  Truett Mainland, MD PCP: Gina Brunette, MD  Chief Complaint  Patient presents with   [place] atherosclerosis      History of Present Illness: .    Gina [name] is a 62 y.o. female with coronary atherosclerosis, tobacco dependance   Patient denies any chest pain, shortness of breath symptoms.  She is smoking more since she has been retired, currently smoking little over 1 pack daily.  He is on nicotine patch at this time.  Vitals:   08/06/23 1428  BP: 116/72  Pulse: 82  SpO2: 97%     ROS:  Review of Systems  Cardiovascular:  Negative for chest pain, dyspnea on exertion, leg swelling, palpitations and syncope.     Studies Reviewed: Marland Kitchen       EKG 08/06/2023: Normal sinus rhythm Normal ECG When compared with ECG of 13-Apr-2014 07:04, No significant change was found    07/17/2023: HbA1C 6.1% Chol 169, TG 47, HDL 57, LDL 102    Physical Exam:   Physical Exam Vitals and nursing note reviewed.  Constitutional:      General: She is not in acute distress. Neck:     Vascular: No JVD.  Cardiovascular:     Rate and Rhythm: Normal rate and regular rhythm.     Heart sounds: Normal heart sounds. No murmur heard. Pulmonary:     Effort: Pulmonary effort is normal.     Breath sounds: Normal breath sounds. No wheezing or rales.  Musculoskeletal:     Right lower leg: No edema.     Left lower leg: No edema.      VISIT DIAGNOSES:   ICD-10-CM   1. Coronary artery disease involving native coronary artery of native heart without angina pectoris  I25.10 EKG 12-Lead    2. Cigarette nicotine dependence without complication  F17.210 nicotine (NICODERM CQ - DOSED IN MG/24 HOURS) 14 mg/24hr patch    nicotine (NICODERM CQ - DOSED IN MG/24 HR) 7 mg/24hr patch       ASSESSMENT AND PLAN: .     Gina [name] is a 62 y.o. female with coronary atherosclerosis, tobacco dependance   Coronary atherosclerosis: No angina symptoms. No ischemia on stress testing in 2021. Reasonable to omit aspirin. Continue Crestor 40 mg daily.  LDL remains >100. Discussed echo Modifications.  She would like to repeat lipid panel in 6 months before considering addition of any other medications, such as Zetia or PCSK9 inhibitor.  Continue current antihypertensive therapy.  Blood pressure well controlled. Encouraged heart healthy diet and lifestyle, no smoking.  Nicotine dependence: Tobacco cessation counseling: - Currently smoking >1 packs/day   - Patient was informed of the dangers of tobacco abuse including stroke, cancer, and MI, as well as benefits of tobacco cessation. - Patient is willing to quit at this time. - Approximately 5 mins were spent counseling patient cessation techniques. We discussed various methods to help quit smoking, including deciding on a date to quit, joining a support group, pharmacological agents. Patient would like to use nicotine patch. Prescribed 14 mg and 7 mg with hopes of weaning down.  - I will reassess her progress at the next follow-up visit     Meds ordered this encounter  Medications   nicotine (NICODERM CQ - DOSED IN MG/24 HOURS) 14 mg/24hr patch    Sig: RX #  2 Weeks 5-6: 14 mg x 1 patch daily. Wear for 24 hours. If you have sleep disturbances, remove at bedtime.    Dispense:  14 patch    Refill:  0   nicotine (NICODERM CQ - DOSED IN MG/24 HR) 7 mg/24hr patch    Sig: RX #3 Weeks 7-8: 7 mg x 1 patch daily. Wear for 24 hours. If you have sleep disturbances, remove at bedtime.    Dispense:  14 patch    Refill:  0     F/u in 6 months  Signed, Elder Negus, MD

## 2023-08-06 NOTE — Patient Instructions (Signed)
Medication Instructions:  Your physician recommends that you continue on your current medications as directed. Please refer to the Current Medication list given to you today.  *If you need a refill on your cardiac medications before your next appointment, please call your pharmacy*  Lab Work: Your physician recommends that you return for lab work in: 6 months for lipid panel  If you have labs (blood work) drawn today and your tests are completely normal, you will receive your results only by: MyChart Message (if you have MyChart) OR A paper copy in the mail If you have any lab test that is abnormal or we need to change your treatment, we will call you to review the results.   Follow-Up: At [facility], you and your health needs are our priority.  As part of our continuing mission to provide you with exceptional heart care, we have created designated Provider Care Teams.  These Care Teams include your primary Cardiologist (physician) and Advanced Practice Providers (APPs -  Physician Assistants and Nurse Practitioners) who all work together to provide you with the care you need, when you need it.  We recommend signing up for the patient portal called "MyChart".  Sign up information is provided on this After Visit Summary.  MyChart is used to connect with patients for Virtual Visits (Telemedicine).  Patients are able to view lab/test results, encounter notes, upcoming appointments, etc.  Non-urgent messages can be sent to your provider as well.   To learn more about what you can do with MyChart, go to [email or web address].    Your next appointment:   6 month(s)  Provider:   Dr. Rosemary Holms     Other Instructions  Diet & Lifestyle recommendations:  Physical activity recommendation (The Physical Activity Guidelines for Americans. JAMA 2018;Nov 12) At least 150-300 minutes a week of moderate-intensity, or 75-150 minutes a week of vigorous-intensity aerobic physical activity, or  an equivalent combination of moderate- and vigorous-intensity aerobic activity. Adults should perform muscle-strengthening activities on 2 or more days a week. Older adults should do multicomponent physical activity that includes balance training as well as aerobic and muscle-strengthening activities. Benefits of increased physical activity include lower risk of mortality including cardiovascular mortality, lower risk of cardiovascular events and associated risk factors (hypertension and diabetes), and lower risk of many cancers (including bladder, breast, colon, endometrium, esophagus, kidney, lung, and stomach). Additional improvments have been seen in cognition, risk of dementia, anxiety and depression, improved bone health, lower risk of falls, and associated injuries.  Dietary recommendation The 2019 ACC/AHA guidelines promote nutrition as a main fixture of cardiovascular wellness, with a recommendation for a varied diet of fruit, vegetables, fish, legumes, and whole grains (Class I), as well as recommendations to reduce sodium, cholesterol, processed meats, and refined sugars (Class IIa recommendation).10 Sodium intake, a topic of some controversy as of late, is recommended to be kept at 1,500 mg/day or less, far below the average daily intake in the [place] of 3,409 mg/day, and notably below that of previous [place] recommendations for 300mg /day.10,11 For those unable to reach 1,500 mg/day, they recommend at least a reduction of 1000 mg/day.  A Pes[place]-Mediterranean Diet With Intermittent Fasting: JACC Review Topic of the Week. J Am Coll Cardiol 2020;76:1484-1493 Pesco-Mediterranean diet, it is supplemented with extra-virgin olive oil (EVOO), which is the principle fat source, along with moderate amounts of dairy (particularly yogurt and cheese) and eggs, as well as modest amounts of alcohol consumption (ideally red wine with the evening meal), but few  red and processed meats.

## 2023-08-09 ENCOUNTER — Other Ambulatory Visit: Payer: Self-pay | Admitting: Acute Care

## 2023-08-09 DIAGNOSIS — Z122 Encounter for screening for malignant neoplasm of respiratory organs: Secondary | ICD-10-CM

## 2023-08-09 DIAGNOSIS — Z87891 Personal history of nicotine dependence: Secondary | ICD-10-CM

## 2023-08-28 ENCOUNTER — Ambulatory Visit
Admission: RE | Admit: 2023-08-28 | Discharge: 2023-08-28 | Disposition: A | Discharge status: Home / Self Care | Payer: 59 | Source: Ambulatory Visit | Attending: Internal Medicine | Admitting: Internal Medicine

## 2023-08-28 DIAGNOSIS — Z1231 Encounter for screening mammogram for malignant neoplasm of breast: Secondary | ICD-10-CM

## 2023-08-29 ENCOUNTER — Encounter: Payer: Self-pay | Admitting: Podiatry

## 2023-08-29 ENCOUNTER — Ambulatory Visit: Payer: 59 | Admitting: Podiatry

## 2023-08-29 DIAGNOSIS — M79671 Pain in right foot: Secondary | ICD-10-CM | POA: Diagnosis not present

## 2023-08-29 DIAGNOSIS — L84 Corns and callosities: Secondary | ICD-10-CM | POA: Diagnosis not present

## 2023-08-29 DIAGNOSIS — M79672 Pain in left foot: Secondary | ICD-10-CM

## 2023-08-30 ENCOUNTER — Ambulatory Visit: Payer: 59 | Admitting: Podiatry

## 2023-09-03 NOTE — Progress Notes (Signed)
Subjective: Chief Complaint  Patient presents with   Callouses    Calluses on both feet need trimming   62 year old female presents the office today for concerns of painful calluses to both of her feet that are painful with walking, pressure.  She does not report any open lesions.  No drainage or any lesions otherwise.  No new concerns.  No injuries.  She did well after the injections last appointment.   Objective: AAO x3, NAD DP/PT pulses palpable bilaterally, CRT less than 3 seconds Hyperkeratotic lesions noted submetatarsal 1 and 5 bilaterally without any underlying ulceration, drainage or any signs of infection.  There is no open lesions noted.  Prominence of metatarsal heads noted plantarly. No pain with calf compression, swelling, warmth, erythema  Assessment: Hyperkeratotic lesions, prominent metatarsal heads  Plan: -All treatment options discussed with the patient including all alternatives, risks, complications.  -Debrided the calluses without any complications or bleeding.  I discussed with him holding off on repeat injections.  Risks of this.  We discussed moisturizer, offloading to help prevent callus formation. -Patient encouraged to call the office with any questions, concerns, change in symptoms.   Vivi Barrack DPM

## 2023-11-29 ENCOUNTER — Ambulatory Visit: Payer: 59 | Admitting: Podiatry

## 2023-12-20 ENCOUNTER — Ambulatory Visit: Payer: 59 | Admitting: Adult Health

## 2023-12-20 ENCOUNTER — Encounter: Payer: Self-pay | Admitting: Adult Health

## 2023-12-20 VITALS — BP 135/84 | HR 84 | Ht 68.5 in | Wt 230.8 lb

## 2023-12-20 DIAGNOSIS — Z720 Tobacco use: Secondary | ICD-10-CM

## 2023-12-20 DIAGNOSIS — J310 Chronic rhinitis: Secondary | ICD-10-CM

## 2023-12-20 DIAGNOSIS — J449 Chronic obstructive pulmonary disease, unspecified: Secondary | ICD-10-CM

## 2023-12-20 MED ORDER — BREZTRI AEROSPHERE 160-9-4.8 MCG/ACT IN AERO: 2.0000 | INHALATION_SPRAY | Freq: Two times a day (BID) | RESPIRATORY_TRACT | 5 refills | Status: DC

## 2023-12-20 MED ORDER — FLUTICASONE PROPIONATE 50 MCG/ACT NA SUSP: 2.0000 | Freq: Every day | NASAL | 5 refills | Status: AC | PRN

## 2023-12-20 MED ORDER — ALBUTEROL SULFATE HFA 108 (90 BASE) MCG/ACT IN AERS: 1.0000 | INHALATION_SPRAY | Freq: Four times a day (QID) | RESPIRATORY_TRACT | 3 refills | Status: DC | PRN

## 2023-12-20 NOTE — Assessment & Plan Note (Signed)
 Smoking cessation discussed

## 2023-12-20 NOTE — Patient Instructions (Addendum)
 BREZTRI 2 puffs Twice daily, rinse after use  Albuterol inhaler As needed   Mucinex DM Twice daily  As needed  cough/congestion  Saline nasal spray As needed   Flonase nasal daily as needed.  Activity as tolerated  Work on quitting smoking  Yearly CT chest per screening program  Follow up with Dewald or Jaslyn Bansal NP in 6 months and As needed  -30 min slot .  Please contact office for sooner follow up if symptoms do not improve or worsen or seek emergency care

## 2023-12-20 NOTE — Progress Notes (Signed)
 @Patient  ID: Gina [name], female    DOB: Feb 28, 1961, 63 y.o.   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 FEV1 at 58%, ratio 73, FVC 61%, DLCO 70%    CT chest July 20, 2022 shows mild emphysema, calcified and noncalcified scattered nodules with largest measuring measuring 1.3 mm,(Lung RADS 2)   12/20/2023 Follow up : COPD  Patient presents for 19-month follow-up.  Patient has moderate COPD with emphysema.  She remains on Breztri twice daily.  Says overall breathing is doing about the same.  Denies any increased cough or wheezing.  No ER or hospitalizations for COPD recently . Had URI few weeks ago, treated for bronchitis with antibiotics. Symptoms resolved.  Does continue to smoke.  We discussed smoking cessation.  Smoking 1 PPD. She does participate in the lung cancer CT screening program.  CT chest July 22, 2023 showed emphysema, smoking-related bronchiolitis, and pulmonary nodules measuring 1.9 mm or less.-Lung RADS 2 Retired. Lives at home. Does not exercise on regular basis. Fully independent.   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 Trying to quit.  12/13/2022 hfb   Ready to quit: Not Answered Counseling given: Not Answered Tobacco comments: Smokes a pack a day of cigarettes. Trying to quit.  12/13/2022 hfb   Outpatient Medications Prior to Visit  Medication Sig Dispense Refill   Aflibercept (EYLEA IO) Inject into the eye.     albuterol (VENTOLIN HFA) 108 (90 Base) MCG/ACT inhaler Inhale 1-2 puffs into the lungs every 6 (six) hours as needed for wheezing or shortness of breath. 8 g 3   Budeson-Glycopyrrol-Formoterol (BREZTRI AEROSPHERE) 160-9-4.8 MCG/ACT AERO Inhale 2 puffs into the lungs 2 (two) times daily. Using as needed (Patient taking differently: Inhale 2 puffs into the lungs 2 (two) times daily.) 1 each 5   fluticasone (FLONASE) 50 MCG/ACT nasal spray Place 2 sprays into the nose as needed. Reported on 12/20/2015     ibuprofen (ADVIL) 800 MG tablet Take 800 mg by mouth every 8 (eight) hours as needed.     nicotine (NICODERM CQ - DOSED IN MG/24 HOURS) 14 mg/24hr patch RX #2 Weeks 5-6: 14 mg x 1 patch daily. Wear for 24 hours. If you have sleep disturbances, remove at bedtime. 14 patch  0   nicotine (NICODERM CQ - DOSED IN MG/24 HR) 7 mg/24hr patch RX #3 Weeks 7-8: 7 mg x 1 patch daily. Wear for 24 hours. If you have sleep disturbances, remove at bedtime. 14 patch 0   rosuvastatin (CRESTOR) 40 MG tablet TAKE 1 TABLET(40 MG) BY MOUTH DAILY 90 tablet 3   VITAMIN D PO Take by mouth daily.     Facility-Administered Medications Prior to Visit  Medication Dose Route Frequency Provider Last Rate Last Admin   gadopentetate dimeglumine (MAGNEVIST) injection  20 mL  20 mL Intravenous Once PRN Huston Foley, MD         Review of Systems:   Constitutional:   No  weight loss, night sweats,  Fevers, chills, fatigue, or  lassitude.  HEENT:   No headaches,  Difficulty swallowing,  Tooth/dental problems, or  Sore throat,                No sneezing, itching, ear ache, +nasal congestion, post nasal drip,   CV:  No chest pain,  Orthopnea, PND, swelling in lower extremities, anasarca, dizziness, palpitations, syncope.   GI  No heartburn, indigestion, abdominal pain, nausea, vomiting, diarrhea, change in bowel habits, loss of appetite, bloody stools.   Resp: No shortness of breath with exertion or at rest.  No excess mucus, no productive cough,  No non-productive cough,  No coughing up of blood.  No change in color of mucus.  No wheezing.  No chest wall deformity  Skin: no rash or lesions.  GU: no dysuria, change in color of urine, no urgency or frequency.  No flank pain, no hematuria   MS:  No joint pain or swelling.  No decreased range of motion.  No back pain.    Physical Exam  BP 135/84   Pulse 84   Ht 5' 8.5" (1.74 m)   Wt 230 lb 12.8 oz (104.7 kg)   SpO2 98%   BMI 34.58 kg/m   GEN: A/Ox3; pleasant , NAD, well nourished    HEENT:  [place]/AT,  NOSE-clear, THROAT-clear, no lesions, no postnasal drip or exudate noted.   NECK:  Supple w/ fair ROM; no JVD; normal carotid impulses w/o bruits; no thyromegaly or nodules palpated; no lymphadenopathy.    RESP  Clear  P & A; w/o, wheezes/ rales/ or rhonchi. no accessory muscle use, no dullness to percussion  CARD:  RRR, no m/r/g, no peripheral edema, pulses intact, no cyanosis or clubbing.  GI:   Soft & nt; nml bowel sounds; no organomegaly or masses detected.   Musco: Warm bil, no deformities or joint swelling noted.   Neuro: alert, no focal deficits noted.    Skin: Warm, no lesions or rashes    Lab Results:  CBC    BNP No results found for: "BNP"  ProBNP No results found for:  "PROBNP"  Imaging: No results found.  Administration History     None          Latest Ref Rng & Units 09/07/2022    9:53 AM  PFT Results  FVC-Pre L 2.24   FVC-Predicted Pre % 58   FVC-Post L 2.36   FVC-Predicted Post % 61   Pre FEV1/FVC % % 73   Post FEV1/FCV % % 73   FEV1-Pre L 1.63   FEV1-Predicted Pre % 55   FEV1-Post L 1.72   DLCO uncorrected ml/min/mmHg 16.30   DLCO UNC% % 70   DLCO corrected ml/min/mmHg 16.30   DLCO COR %Predicted %  70   DLVA Predicted % 96   TLC L 4.94   TLC % Predicted % 86   RV % Predicted % 108     No results found for: "NITRICOXIDE"      Assessment & Plan:   No problem-specific Assessment & Plan notes found for this encounter.     Rubye Oaks, NP 12/20/2023

## 2023-12-20 NOTE — Assessment & Plan Note (Signed)
 COPD with emphysema - recent bronchitis improved with antibiotic support. Stable on Breztri . Discussed smoking cessation  Plan  Patient Instructions  BREZTRI 2 puffs Twice daily, rinse after use  Albuterol inhaler As needed   Mucinex DM Twice daily  As needed  cough/congestion  Saline nasal spray As needed   Flonase nasal daily as needed.  Activity as tolerated  Work on quitting smoking  Yearly CT chest per screening program  Follow up with Dewald or Calea Hribar NP in 6 months and As needed  -30 min slot .  Please contact office for sooner follow up if symptoms do not improve or worsen or seek emergency care

## 2023-12-20 NOTE — Assessment & Plan Note (Signed)
 Chronic rhinitis - continue on Flonase As needed   Add saline nasal spray As needed

## 2024-05-29 ENCOUNTER — Ambulatory Visit: Admitting: Acute Care

## 2024-05-29 ENCOUNTER — Encounter: Payer: Self-pay | Admitting: Acute Care

## 2024-05-29 VITALS — BP 130/84 | HR 84 | Temp 97.7°F | Ht 68.0 in | Wt 233.8 lb

## 2024-05-29 DIAGNOSIS — I700 Atherosclerosis of aorta: Secondary | ICD-10-CM | POA: Diagnosis not present

## 2024-05-29 DIAGNOSIS — J439 Emphysema, unspecified: Secondary | ICD-10-CM | POA: Diagnosis not present

## 2024-05-29 DIAGNOSIS — F17210 Nicotine dependence, cigarettes, uncomplicated: Secondary | ICD-10-CM

## 2024-05-29 DIAGNOSIS — F17200 Nicotine dependence, unspecified, uncomplicated: Secondary | ICD-10-CM

## 2024-05-29 DIAGNOSIS — J432 Centrilobular emphysema: Secondary | ICD-10-CM | POA: Diagnosis not present

## 2024-05-29 NOTE — Patient Instructions (Addendum)
 It is good to see you today. It sounds like you are doing well. Continue BREZTRI  2 puffs Twice daily, rinse after use  Albuterol  inhaler As needed  for wheezing of shortness of breath Activity as tolerated  Yearly CT chest per screening program,  you have this scheduled 07/22/2024 Follow up with Dewald >> 30 minute slot to establish as a new provider,  in 6 months  Note your daily symptoms > remember red flags for COPD:  Increase in cough, increase in sputum production, increase in shortness of breath or activity intolerance. If you notice these symptoms, please call to be seen.    Please contact office for sooner follow up if symptoms do not improve or worsen or seek emergency care   Please work on quitting smoking You can receive free nicotine  replacement therapy (patches, gum, or mints) by calling 1-800-QUIT NOW. Please call so we can get you on the path to becoming a non-smoker. I know it is hard, but you can do this!  Hypnosis for smoking cessation  [employer] [phone]  Acupuncture for smoking cessation  [facility] [phone]    Dr. Evern, your cardiologist,  new contact [phone] Call if you need us  sooner. Please contact office for sooner follow up if symptoms do not improve or worsen or seek emergency care

## 2024-05-29 NOTE — Progress Notes (Signed)
 History of Present Illness Gina [name] is a 63 y.o. female  active smoker seen for pulmonary consult July 25, 2022 for COPD with emphysema Participates in the lung cancer CT chest screening program.   05/29/2024 Discussed the use of AI scribe software for clinical note transcription with the patient, who gave verbal consent to proceed.  History of Present Illness Gina [name] is a 63 year old female current every day smoker with COPD with emphysema who presents for a six-month follow-up.  She reports no recent COPD exacerbations or need for antibiotics. No ER visits.She experiences shortness of breath only with overexertion and uses her rescue inhaler, albuterol , occasionally at night if she feels wheezing. There is no regular use of the rescue inhaler and no recent sputum production. She is currently using Breztri , two puffs in the morning and two puffs in the evening.  She is due for her next lung cancer screening scan on October 1st and has no concerns regarding her breathing. She continues to smoke.She denies any unintentional weight loss or hemoptysis. She is not currently using Mucinex regularly and uses saline nasal spray and Flonase  as needed.  Her past medical history includes emphysema, small lung nodules, aortic atherosclerosis, and coronary artery calcifications. She is under the care of a cardiologist, Dr. Bruna Rao, but has had difficulty reaching him due to a change in his office location. I have provided her with his new contact information.     Test Results: Alpha-1 MM, 123   09/07/22 Pulmonary function testing that shows moderate restriction. FEV1 at 58%, ratio 73, FVC 61%, DLCO 70%    CT chest July 20, 2022 shows mild emphysema, calcified and noncalcified scattered nodules with largest measuring measuring 1.3 mm,(Lung RADS 2)       Latest Ref Rng & Units 11/16/2020    3:03 PM 04/13/2014    7:59 AM 10/05/2012    6:20 PM  CBC  WBC 4.0 - 10.5  K/uL 8.5  7.5  7.0   Hemoglobin 12.0 - 15.0 g/dL 85.5  85.8  85.0   Hematocrit 36.0 - 46.0 % 43.3  41.9  42.7   Platelets 150 - 400 K/uL 257  273  279        Latest Ref Rng & Units 11/16/2020    3:03 PM 04/13/2014    7:59 AM 10/05/2012    6:20 PM  BMP  Glucose 70 - 99 mg/dL 877  877  98   BUN 6 - 20 mg/dL 13  13  15    Creatinine 0.44 - 1.00 mg/dL 9.33  9.29  8.99   Sodium 135 - 145 mmol/L 138  142  141   Potassium 3.5 - 5.1 mmol/L 3.4  3.9  3.7   Chloride 98 - 111 mmol/L 104  105  105   CO2 22 - 32 mmol/L 25  27  25    Calcium  8.9 - 10.3 mg/dL 9.0  9.1  9.3     BNP No results found for: BNP  ProBNP No results found for: PROBNP  PFT    Component Value Date/Time   FEV1PRE 1.63 09/07/2022 0953   FEV1POST 1.72 09/07/2022 0953   FVCPRE 2.24 09/07/2022 0953   FVCPOST 2.36 09/07/2022 0953   TLC 4.94 09/07/2022 0953   DLCOUNC 16.30 09/07/2022 0953   PREFEV1FVCRT 73 09/07/2022 0953   PSTFEV1FVCRT 73 09/07/2022 0953    No results found.   Past medical hx Past Medical History:  Diagnosis Date   Cataract  Chronic constipation w/ dyssynergic defecation by anorectal manometry 12/13/2011   Dysphagia, oral phase    Esophageal reflux    Esophageal web 01/03/2021   Family history of malignant neoplasm of gastrointestinal tract    Gastric AVM 01/03/2021   Hiatal hernia    Irritable bowel syndrome    Unspecified vitamin D deficiency      Social History   Tobacco Use   Smoking status: Every Day    Current packs/day: 1.50    Average packs/day: 1.5 packs/day for 30.0 years (45.0 ttl pk-yrs)    Types: Cigarettes   Smokeless tobacco: Never   Tobacco comments:    Smokes a half a pack per day, trying to quit. Msh 05/29/2024  Vaping Use   Vaping status: Former  Substance Use Topics   Alcohol use: Yes    Comment: occ.   Drug use: No    Gina [name] reports that she has been smoking cigarettes. She has a 45 pack-year smoking history. She has never used smokeless tobacco.  She reports current alcohol use. She reports that she does not use drugs.  Tobacco Cessation: Ready to quit: Not Answered Counseling given: Not Answered Tobacco comments: Smokes a half a pack per day, trying to quit. Msh 05/29/2024 Current every day smoker , I spent 3-4 minutes counseling patient on  steps to stop use of tobacco products. I have provided patient with information on receiving free nicotine  replacement therapy, and contact numbers for hypnosis for smoking cessation as well as acupuncture for smoking cessation.   Past surgical hx, Family hx, Social hx all reviewed.  Current Outpatient Medications on File Prior to Visit  Medication Sig   albuterol  (VENTOLIN  HFA) 108 (90 Base) MCG/ACT inhaler Inhale 1-2 puffs into the lungs every 6 (six) hours as needed for wheezing or shortness of breath.   Budeson-Glycopyrrol-Formoterol (BREZTRI  AEROSPHERE) 160-9-4.8 MCG/ACT AERO Inhale 2 puffs into the lungs 2 (two) times daily.   fluticasone  (FLONASE ) 50 MCG/ACT nasal spray Place 2 sprays into both nostrils daily as needed.   ibuprofen  (ADVIL ) 800 MG tablet Take 800 mg by mouth every 8 (eight) hours as needed.   rosuvastatin  (CRESTOR ) 40 MG tablet TAKE 1 TABLET(40 MG) BY MOUTH DAILY   VITAMIN D PO Take by mouth daily.   Aflibercept (EYLEA IO) Inject into the eye. (Patient not taking: Reported on 05/29/2024)   nicotine  (NICODERM CQ  - DOSED IN MG/24 HOURS) 14 mg/24hr patch RX #2 Weeks 5-6: 14 mg x 1 patch daily. Wear for 24 hours. If you have sleep disturbances, remove at bedtime. (Patient not taking: Reported on 05/29/2024)   nicotine  (NICODERM CQ  - DOSED IN MG/24 HR) 7 mg/24hr patch RX #3 Weeks 7-8: 7 mg x 1 patch daily. Wear for 24 hours. If you have sleep disturbances, remove at bedtime. (Patient not taking: Reported on 05/29/2024)   Current Facility-Administered Medications on File Prior to Visit  Medication   gadopentetate dimeglumine  (MAGNEVIST ) injection 20 mL     Allergies  Allergen  Reactions   Sulfa Antibiotics Other (See Comments)    unknown    Review Of Systems:  Constitutional:   No  weight loss, night sweats,  Fevers, chills, fatigue, or  lassitude.  HEENT:   No headaches,  Difficulty swallowing,  Tooth/dental problems, or  Sore throat,                No sneezing, itching, ear ache, nasal congestion, post nasal drip,   CV:  No chest pain,  Orthopnea, PND, swelling  in lower extremities, anasarca, dizziness, palpitations, syncope.   GI  No heartburn, indigestion, abdominal pain, nausea, vomiting, diarrhea, change in bowel habits, loss of appetite, bloody stools.   Resp: No shortness of breath with exertion or at rest.  No excess mucus, no productive cough,  No non-productive cough,  No coughing up of blood.  No change in color of mucus.  No wheezing.  No chest wall deformity  Skin: no rash or lesions.  GU: no dysuria, change in color of urine, no urgency or frequency.  No flank pain, no hematuria   MS:  No joint pain or swelling.  No decreased range of motion.  No back pain.  Psych:  No change in mood or affect. No depression or anxiety.  No memory loss.   Vital Signs BP 130/84   Pulse 84   Temp 97.7 F (36.5 C)   Ht 5' 8 (1.727 m)   Wt 233 lb 12.8 oz (106.1 kg)   SpO2 99% Comment: RA  BMI 35.55 kg/m    Physical Exam:  General- No distress,  A&Ox3, pleasant ENT: No sinus tenderness, TM clear, pale nasal mucosa, no oral exudate,no post nasal drip, no LAN Cardiac: S1, S2, regular rate and rhythm, no murmur Chest: No wheeze/ rales/ dullness; no accessory muscle use, no nasal flaring, no sternal retractions, few rhonchi Abd.: Soft Non-tender, ND, BS +, Body mass index is 35.55 kg/m.  Ext: No clubbing cyanosis, edema, no obvious deformities Neuro:  normal strength, MAE x 4, A&O x 4, Body mass index is 35.55 kg/m.  Skin: No rashes, warm and dry, no obvious skin lesions  Psych: normal mood and behavior   Assessment & Plan Chronic obstructive  pulmonary disease (COPD) with emphysema COPD is well-managed with no recent exacerbations. Occasional exertional dyspnea occurs. Current treatment with Breztri  is effective. - Continue Breztri  two puffs twice daily, rinse after use. - Use albuterol  inhaler as needed for wheezing or shortness of breath. - Educated on recognizing COPD flares, including increased shortness of breath, increased sputum production, and wheezing. - Instructed to call if experiencing a flare or if prednisone is needed. -Follow up with Dewald >> 30 minute slot to establish as a new provider,  in 6 months  Note your daily symptoms > remember red flags for COPD:  Increase in cough, increase in sputum production, increase in shortness of breath or activity intolerance. If you notice these symptoms, please call to be seen.     Tobacco use disorder Continues to smoke despite known risks. Provided information on smoking cessation options. - Provide information on smoking cessation resources. - Encourage smoking cessation and offer support when ready. - Please work on quitting smoking You can receive free nicotine  replacement therapy (patches, gum, or mints) by calling 1-800-QUIT NOW. Please call so we can get you on the path to becoming a non-smoker. I know it is hard, but you can do this!  Hypnosis for smoking cessation  [employer] [phone]  Acupuncture for smoking cessation  [employer] [phone]   Stable pulmonary nodules Pulmonary nodules are stable with previous scans showing no significant changes. Next lung cancer screening is scheduled for October 1st. - Perform lung cancer screening scan on October 1st. - Call with results of the lung cancer screening scan.  Aortic atherosclerosis and coronary artery calcification Aortic atherosclerosis and coronary artery calcification noted on previous imaging. She is under the care of a cardiologist but is experiencing difficulty contacting  her due to  a change in her office location. - Provide updated contact information for cardiologist Dr.    I spent 20 minutes dedicated to the care of this patient on the date of this encounter to include pre-visit review of records, face-to-face time with the patient discussing conditions above, post visit ordering of testing, clinical documentation with the electronic health record, making appropriate referrals as documented, and communicating necessary information to the patient's healthcare team.     Lauraine JULIANNA Lites, NP 05/29/2024  8:58 AM

## 2024-07-07 ENCOUNTER — Ambulatory Visit: Admitting: Adult Health

## 2024-07-16 LAB — LAB REPORT - SCANNED: EGFR: 78

## 2024-07-22 ENCOUNTER — Ambulatory Visit (HOSPITAL_BASED_OUTPATIENT_CLINIC_OR_DEPARTMENT_OTHER)
Admission: RE | Admit: 2024-07-22 | Discharge: 2024-07-22 | Disposition: A | Discharge status: Home / Self Care | Source: Ambulatory Visit | Attending: Acute Care | Admitting: Acute Care

## 2024-07-22 DIAGNOSIS — Z122 Encounter for screening for malignant neoplasm of respiratory organs: Secondary | ICD-10-CM | POA: Diagnosis present

## 2024-07-22 DIAGNOSIS — Z87891 Personal history of nicotine dependence: Secondary | ICD-10-CM | POA: Diagnosis present

## 2024-07-23 ENCOUNTER — Other Ambulatory Visit: Payer: Self-pay | Admitting: Registered Nurse

## 2024-07-23 DIAGNOSIS — R918 Other nonspecific abnormal finding of lung field: Secondary | ICD-10-CM

## 2024-07-24 ENCOUNTER — Ambulatory Visit: Payer: Self-pay | Admitting: Cardiology

## 2024-07-24 DIAGNOSIS — E782 Mixed hyperlipidemia: Secondary | ICD-10-CM

## 2024-07-24 NOTE — Progress Notes (Signed)
 LDL remains very high at 143.  Goal LDL is <70.  Please check if patient is taking Crestor  40 mg daily.  If yes, referred to lipid clinic for consideration for injectable agents if patient is amenable.  If not taking Crestor  40 mg daily, please ensure compliance and repeat lipid panel in 3 months.  Thanks MJP

## 2024-07-28 ENCOUNTER — Other Ambulatory Visit: Payer: Self-pay

## 2024-07-28 ENCOUNTER — Telehealth: Payer: Self-pay

## 2024-07-28 DIAGNOSIS — R911 Solitary pulmonary nodule: Secondary | ICD-10-CM

## 2024-07-28 DIAGNOSIS — Z122 Encounter for screening for malignant neoplasm of respiratory organs: Secondary | ICD-10-CM

## 2024-07-28 DIAGNOSIS — F17210 Nicotine dependence, cigarettes, uncomplicated: Secondary | ICD-10-CM

## 2024-07-28 DIAGNOSIS — Z87891 Personal history of nicotine dependence: Secondary | ICD-10-CM

## 2024-07-28 NOTE — Progress Notes (Signed)
 Okay with repeat lipid panel in 3 months.  Thanks MJP

## 2024-07-28 NOTE — Telephone Encounter (Signed)
 Called and spoke with patient. Reviewed recent Lung CT results. She has been scheduled for a 6 month follow up scan on 02/08/2025. She is on statin. Results and plan to PCP.   IMPRESSION: Lung-RADS 3, probably benign findings. Short-term follow-up in 6 months is recommended with repeat low-dose chest CT without contrast (please use the following order, CT CHEST LCS NODULE FOLLOW-UP W/O CM).   Coronary artery calcifications.   Aortic Atherosclerosis (ICD10-I70.0) and Emphysema (ICD10-J43.9).

## 2024-07-30 ENCOUNTER — Other Ambulatory Visit

## 2024-08-04 NOTE — Progress Notes (Signed)
 Pt contacted and advised. Pt agrees with plan of care.

## 2024-08-28 ENCOUNTER — Ambulatory Visit: Attending: Cardiology | Admitting: Cardiology

## 2024-08-28 ENCOUNTER — Encounter: Payer: Self-pay | Admitting: Cardiology

## 2024-08-28 VITALS — BP 138/85 | HR 71 | Ht 68.5 in

## 2024-08-28 DIAGNOSIS — F17210 Nicotine dependence, cigarettes, uncomplicated: Secondary | ICD-10-CM

## 2024-08-28 DIAGNOSIS — E782 Mixed hyperlipidemia: Secondary | ICD-10-CM | POA: Diagnosis not present

## 2024-08-28 DIAGNOSIS — R600 Localized edema: Secondary | ICD-10-CM | POA: Diagnosis not present

## 2024-08-28 DIAGNOSIS — I2510 Atherosclerotic heart disease of native coronary artery without angina pectoris: Secondary | ICD-10-CM | POA: Diagnosis not present

## 2024-08-28 MED ORDER — BUPROPION HCL ER (SR) 100 MG PO TB12
100.0000 mg | ORAL_TABLET | Freq: Two times a day (BID) | ORAL | 3 refills | Status: AC
Start: 2024-08-28 — End: ?

## 2024-08-28 NOTE — Patient Instructions (Signed)
 Medication Instructions:  Your physician has recommended you make the following change in your medication:  START: Bupropion (Wellbutrin) 100 mg twice daily  *If you need a refill on your cardiac medications before your next appointment, please call your pharmacy*  Lab Work: ProBNP If you have labs (blood work) drawn today and your tests are completely normal, you will receive your results only by: MyChart Message (if you have MyChart) OR A paper copy in the mail If you have any lab test that is abnormal or we need to change your treatment, we will call you to review the results.  Testing/Procedures: Your physician has requested that you have an echocardiogram. Echocardiography is a painless test that uses sound waves to create images of your heart. It provides your doctor with information about the size and shape of your heart and how well your heart's chambers and valves are working. This procedure takes approximately one hour. There are no restrictions for this procedure. Please do NOT wear cologne, perfume, aftershave, or lotions (deodorant is allowed). Please arrive 15 minutes prior to your appointment time.  Please note: We ask at that you not bring children with you during ultrasound (echo/ vascular) testing. Due to room size and safety concerns, children are not allowed in the ultrasound rooms during exams. Our front office staff cannot provide observation of children in our lobby area while testing is being conducted. An adult accompanying a patient to their appointment will only be allowed in the ultrasound room at the discretion of the ultrasound technician under special circumstances. We apologize for any inconvenience.     Please report to Radiology at the [facility] Main Entrance 30 minutes early for your test.  [address] [place], [place] [address]                         OR   Please report to Radiology at [facility] Main Entrance,  medical mall, 30 mins prior to your test.  [address]  [place], [place]  How to Prepare for Your Cardiac PET/CT Stress Test:  Nothing to eat or drink, except water, 3 hours prior to arrival time.  NO caffeine/decaffeinated products, or chocolate 12 hours prior to arrival. (Please note decaffeinated beverages (teas/coffees) still contain caffeine).  If you have caffeine within 12 hours prior, the test will need to be rescheduled.  Medication instructions: Do not take erectile dysfunction medications for 72 hours prior to test (sildenafil, tadalafil) Do not take nitrates (isosorbide mononitrate, Ranexa) the day before or day of test Do not take tamsulosin the day before or morning of test Hold theophylline containing medications for 12 hours. Hold Dipyridamole 48 hours prior to the test.  Diabetic Preparation: If able to eat breakfast prior to 3 hour fasting, you may take all medications, including your insulin. Do not worry if you miss your breakfast dose of insulin - start at your next meal. If you do not eat prior to 3 hour fast-Hold all diabetes (oral and insulin) medications. Patients who wear a continuous glucose monitor MUST remove the device prior to scanning.  You may take your remaining medications with water.  NO perfume, cologne or lotion on chest or abdomen area. FEMALES - Please avoid wearing dresses to this appointment.  Total time is 1 to 2 hours; you may want to bring reading material for the waiting time.  IF YOU THINK YOU MAY BE PREGNANT, OR ARE NURSING PLEASE  INFORM THE TECHNOLOGIST.  In preparation for your appointment, medication and supplies will be purchased.  Appointment availability is limited, so if you need to cancel or reschedule, please call the Radiology Department Scheduler at [phone] 24 hours in advance to avoid a cancellation fee of $100.00  What to Expect When you Arrive:  Once you arrive and check in for your appointment, you will be  taken to a preparation room within the Radiology Department.  A technologist or Nurse will obtain your medical history, verify that you are correctly prepped for the exam, and explain the procedure.  Afterwards, an IV will be started in your arm and electrodes will be placed on your skin for EKG monitoring during the stress portion of the exam. Then you will be escorted to the PET/CT scanner.  There, staff will get you positioned on the scanner and obtain a blood pressure and EKG.  During the exam, you will continue to be connected to the EKG and blood pressure machines.  A small, safe amount of a radioactive tracer will be injected in your IV to obtain a series of pictures of your heart along with an injection of a stress agent.    After your Exam:  It is recommended that you eat a meal and drink a caffeinated beverage to counter act any effects of the stress agent.  Drink plenty of fluids for the remainder of the day and urinate frequently for the first couple of hours after the exam.  Your doctor will inform you of your test results within 7-10 business days.  For more information and frequently asked questions, please visit our website: [email or web address]  For questions about your test or how to prepare for your test, please call: Cardiac Imaging Nurse Navigators Office: [phone]  Follow-Up: At [facility], you and your health needs are our priority.  As part of our continuing mission to provide you with exceptional heart care, our providers are all part of one team.  This team includes your primary Cardiologist (physician) and Advanced Practice Providers or APPs (Physician Assistants and Nurse Practitioners) who all work together to provide you with the care you need, when you need it.  Your next appointment:    February 2026  Provider:   Newman JINNY Lawrence, MD     Other Instructions:

## 2024-08-28 NOTE — Progress Notes (Signed)
 Cardiology Office Note:  .   Date:  08/28/2024  ID:  Gina [name], DOB November 15, 1960, MRN [id number] PCP: Clarice Nottingham, MD  [facility] Providers Cardiologist:  Newman Lawrence, MD PCP: Clarice Nottingham, MD  Chief Complaint  Patient presents with   Coronary Artery Disease      History of Present Illness: .    Gina [name] is a 63 y.o. female with CAD, mixed hyperlipidemia, nicotine  dependence   Patient is here for 1 year follow-up.  She has not had any new chest pain, but has occasional leg edema.  Her physical activity is limited due to calluses on her feet.  Unfortunately, she continues to smoke 1 pack/day.  Reviewed recent lipid panel results with the patient, details below.  Vitals:   08/28/24 1043 08/28/24 1111  BP: (!) 158/88 138/85  Pulse: 71   SpO2: 92%       ROS:  Review of Systems  Cardiovascular:  Negative for chest pain, dyspnea on exertion, leg swelling, palpitations and syncope.     Studies Reviewed: Gina [name]       EKG 08/28/2024: Normal sinus rhythm Normal ECG When compared with ECG of 06-Aug-2023 14:26, No significant change was found     Echocardiogram 2022:  Left ventricle cavity is normal in size. Mild concentric hypertrophy of  the left ventricle. Normal global wall motion. Normal LV systolic function  with EF 71%. Doppler evidence of grade I (impaired) diastolic dysfunction,  normal LAP. No significant valvular abnormality.  Normal right atrial pressure.     Labs 06/2024: Chol 208, TG 67, HDL 53, LDL 143  07/17/2023: HbA1C 3.8% Chol 169, TG 47, HDL 57, LDL 102    Physical Exam:   Physical Exam Vitals and nursing note reviewed.  Constitutional:      General: She is not in acute distress. Neck:     Vascular: No JVD.  Cardiovascular:     Rate and Rhythm: Normal rate and regular rhythm.     Heart sounds: Normal heart sounds. No murmur heard. Pulmonary:     Effort: Pulmonary effort is normal.     Breath sounds:  Normal breath sounds. No wheezing or rales.  Musculoskeletal:     Right lower leg: No edema.     Left lower leg: No edema.      VISIT DIAGNOSES:   ICD-10-CM   1. Coronary artery disease involving native coronary artery of native heart without angina pectoris  I25.10 EKG 12-Lead    NM PET CT CARDIAC PERFUSION MULTI W/ABSOLUTE BLOODFLOW    Cardiac Stress Test: Informed Consent Details: Physician/Practitioner Attestation; Transcribe to consent form and obtain patient signature    2. Mixed hyperlipidemia  E78.2 EKG 12-Lead    3. Leg edema, left  R60.0 ECHOCARDIOGRAM COMPLETE    Pro b natriuretic peptide (BNP)    4. Cigarette nicotine  dependence without complication  F17.210         ASSESSMENT AND PLAN: .    Gina [name] is a 63 y.o. female with CAD, mixed hyperlipidemia, nicotine  dependence    Coronary artery disease: Consistently noted on lung cancer screening CT chest. No clear anginal symptoms, physical activity is elevated. Recommend echocardiogram and PET/CT stress test for ischemia evaluation. See below regarding smoking cessation. Emphasized importance of compliance with lipid-lowering therapy. Continue Crestor  40 mg daily.  Check lipid panel in 10/2024. Given recent leg swelling, although trace, will also check proBNP.  Nicotine  dependence: Tobacco cessation counseling:  - Currently smoking 1 pack/day   -  Patient was informed of the dangers of tobacco abuse including stroke, cancer, and MI, as well as benefits of tobacco cessation. - Patient is willing to quit at this time. - Approximately 5 mins were spent counseling patient cessation techniques. We discussed various methods to help quit smoking, including deciding on a date to quit, joining a support group, pharmacological agents. Patient would like to use Wellbutrin 100 mg twice daily. - I will reassess her progress at the next follow-up visit   Meds ordered this encounter  Medications   buPROPion ER  (WELLBUTRIN SR) 100 MG 12 hr tablet    Sig: Take 1 tablet (100 mg total) by mouth 2 (two) times daily.    Dispense:  180 tablet    Refill:  3     F/u in 6 months  Signed, Newman JINNY Lawrence, MD

## 2024-08-29 LAB — PRO B NATRIURETIC PEPTIDE: NT-Pro BNP: 64 pg/mL (ref 0–287)

## 2024-08-31 ENCOUNTER — Ambulatory Visit: Payer: Self-pay | Admitting: Cardiology

## 2024-09-01 ENCOUNTER — Ambulatory Visit: Admitting: Podiatry

## 2024-09-01 ENCOUNTER — Ambulatory Visit (INDEPENDENT_AMBULATORY_CARE_PROVIDER_SITE_OTHER)

## 2024-09-01 DIAGNOSIS — M7751 Other enthesopathy of right foot: Secondary | ICD-10-CM

## 2024-09-01 DIAGNOSIS — M7752 Other enthesopathy of left foot: Secondary | ICD-10-CM

## 2024-09-01 DIAGNOSIS — M778 Other enthesopathies, not elsewhere classified: Secondary | ICD-10-CM

## 2024-09-01 DIAGNOSIS — M7750 Other enthesopathy of unspecified foot: Secondary | ICD-10-CM

## 2024-09-01 NOTE — Patient Instructions (Signed)
 You can use UREA CREAM on the calluses  --  While at your visit today you received a steroid injection in your foot or ankle to help with your pain. Along with having the steroid medication there is some numbing medication in the shot that you received. Due to this you may notice some numbness to the area for the next couple of hours.   I would recommend limiting activity for the next few days to help the steroid injection take affect.    The actually benefit from the steroid injection may take up to 2-7 days to see a difference. You may actually experience a small (as in 10%) INCREASE in pain in the first 24 hours---that is common. It would be best if you can ice the area today and take anti-inflammatory medications (such as Ibuprofen , Motrin , or Aleve ) if you are able to take these medications. If you were prescribed another medication to help with the pain go ahead and start that medication today    Things to watch out for that you should contact us  or a health care provider urgently would include: 1. Unusual (as in more than 10%) increase in pain 2. New fever > 101.5 3. New swelling or redness of the injected area.  4. Streaking of red lines around the area injected.  If you have any questions or concerns about this, please give our office a call at [phone].

## 2024-09-02 NOTE — Progress Notes (Signed)
 Subjective:   Patient ID: Gina [name], female   DOB: 63 y.o.   MRN: [id number]   HPI Chief Complaint  Patient presents with   Foot Pain    Patient is here for bilateral foot swelling and pain   63 year old female presents with the above concerns.  She said that she gets more of a burning, pain sensation which is localized in the foot.  This seems to be along the area of the calluses.  She was treated for this previously and she states they did help but her pain has come back.  She does not report any recent injuries.  No radiating pain.  No other treatment.  No other concerns.   Review of Systems  All other systems reviewed and are negative.       Objective:  Physical Exam  General: AAO x3, NAD  Dermatological: Hyperkeratotic lesions noted submetatarsal 1 and 5 bilaterally without any underlying ulceration, drainage or any signs of infection.  There are no open lesions.  Vascular: Dorsalis Pedis artery and Posterior Tibial artery pedal pulses are 2/4 bilateral with immedate capillary fill time. There is no pain with calf compression, swelling, warmth, erythema.   Neruologic: Grossly intact via light touch bilateral.   Musculoskeletal: Tenderness is on the area of hyperkeratotic lesions.  There is prominent metatarsal heads plantarly as well as prominent sesamoids.  There is no other areas of pinpoint tenderness identified at this time.  No edema, erythema.  MMT 5/5.       Assessment:   63 year old female prominent metatarsal heads, capsulitis resulting hyperkeratotic lesions     Plan:  -Treatment options discussed including all alternatives, risks, and complications -Etiology of symptoms were discussed - X-rays were obtained reviewed.  Multiple views obtained.  No evidence of acute fracture.  Metatarsus adductus is noted. -Discussed her injections and she wishes to proceed.  Submetatarsal 1 and 5 bilaterally mixture of 0.5 cc of dexamethasone  phosphate, 0.5 cc of  lidocaine plain was infiltrated complications.  Postinjection care discussed.  Tolerated well. -Regards to debrided hyperkeratotic lesions were any complications or bleeding -Long-term discussed inserts to help decrease the pressure.  I will have her help with Sueanne, pedorthist for this.  No follow-ups on file.  Donnice JONELLE Fees DPM

## 2024-09-11 ENCOUNTER — Other Ambulatory Visit: Payer: Self-pay | Admitting: Internal Medicine

## 2024-09-11 DIAGNOSIS — Z1231 Encounter for screening mammogram for malignant neoplasm of breast: Secondary | ICD-10-CM

## 2024-09-22 ENCOUNTER — Ambulatory Visit: Admitting: Pulmonary Disease

## 2024-09-22 ENCOUNTER — Encounter: Payer: Self-pay | Admitting: Pulmonary Disease

## 2024-09-22 VITALS — BP 122/70 | HR 80 | Ht 68.0 in | Wt 238.0 lb

## 2024-09-22 DIAGNOSIS — F17210 Nicotine dependence, cigarettes, uncomplicated: Secondary | ICD-10-CM | POA: Diagnosis not present

## 2024-09-22 DIAGNOSIS — R911 Solitary pulmonary nodule: Secondary | ICD-10-CM

## 2024-09-22 DIAGNOSIS — J432 Centrilobular emphysema: Secondary | ICD-10-CM

## 2024-09-22 DIAGNOSIS — J449 Chronic obstructive pulmonary disease, unspecified: Secondary | ICD-10-CM | POA: Diagnosis not present

## 2024-09-22 MED ORDER — BREZTRI AEROSPHERE 160-9-4.8 MCG/ACT IN AERO: 2.0000 | INHALATION_SPRAY | Freq: Two times a day (BID) | RESPIRATORY_TRACT | 6 refills | Status: AC

## 2024-09-22 MED ORDER — ALBUTEROL SULFATE HFA 108 (90 BASE) MCG/ACT IN AERS: 1.0000 | INHALATION_SPRAY | Freq: Four times a day (QID) | RESPIRATORY_TRACT | 6 refills | Status: AC | PRN

## 2024-09-22 NOTE — Patient Instructions (Addendum)
 Continue lung cancer screening scans  Continue breztri  inhaler 2 puffs twice daily - rinse mouth out after each use  Use albuterol  inhaler 1-2 puffs every 4-6 hours as needed  Recommend starting wellbutrin  for smoking cessation - take 1 pill per day for 7 days then increase to twice daily there after  Recommend using 14-21mg  nicotine  patches per day along with mini nicotine  lozenges for break through cravings  Call 1-800-quit-NOW to get free nicotine  replacement and counseling from the state of [place]    Follow up in 6 months, call sooner if needed

## 2024-09-22 NOTE — Progress Notes (Unsigned)
 Established Patient Pulmonology Office Visit   Subjective:  Patient ID: Gina [name], female    DOB: 1961/02/08  MRN: [id number]  CC:  Chief Complaint  Patient presents with   Medical Management of Chronic Issues    Pt states no concerns     Discussed the use of AI scribe software for clinical note transcription with the patient, who gave verbal consent to proceed.  History of Present Illness Gina [name] is a 63 year old female with COPD and emphysema who presents for a follow-up visit.  She reports stable breathing on Breztri  two puffs twice daily, with infrequent albuterol  use, only occasional cough and mucus, and no significant shortness of breath.  She is in a lung cancer screening program. CT on July 22, 2024 showed a new 5.5 mm left lower lobe nodule, Lung-RADS 3, with a recommended 24-month follow-up scan.  She smokes about one pack per day and is trying to cut down with a goal to quit by March on her birthday. She has a bupropion  prescription from her cardiologist that she has not yet started and has never used nicotine  replacement products.  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        ROS    Current Outpatient Medications:    Aflibercept (EYLEA IO), Inject into the eye., Disp: , Rfl:    buPROPion  ER (WELLBUTRIN  SR) 100 MG 12 hr tablet, Take 1 tablet (100 mg total) by mouth 2 (two) times daily., Disp: 180 tablet, Rfl: 3   fluticasone  (FLONASE ) 50 MCG/ACT nasal spray, Place 2 sprays into both nostrils daily as needed., Disp: 1 g, Rfl: 5   rosuvastatin  (CRESTOR ) 40 MG tablet, TAKE 1 TABLET(40 MG) BY MOUTH DAILY, Disp: 90 tablet, Rfl: 3   VITAMIN D PO, Take by mouth daily., Disp: , Rfl:    albuterol  (VENTOLIN  HFA) 108 (90 Base) MCG/ACT inhaler, Inhale 1-2 puffs into the lungs every 6 (six) hours as needed for wheezing or shortness of breath., Disp: 8 g, Rfl: 6    budesonide-glycopyrrolate-formoterol (BREZTRI  AEROSPHERE) 160-9-4.8 MCG/ACT AERO inhaler, Inhale 2 puffs into the lungs 2 (two) times daily., Disp: 1 each, Rfl: 6 No current facility-administered medications for this visit.  Facility-Administered Medications Ordered in Other Visits:    gadopentetate dimeglumine  (MAGNEVIST ) injection 20 mL, 20 mL, Intravenous, Once PRN, Athar, Saima, MD      Objective:  BP 122/70   Pulse 80   Ht 5' 8 (1.727 m) Comment: per pt  Wt 238 lb (108 kg)   SpO2 98%   BMI 36.19 kg/m     Physical Exam Constitutional:      General: She is not in acute distress.    Appearance: Normal appearance. She is obese.  Eyes:     General: No scleral icterus.    Conjunctiva/sclera: Conjunctivae normal.  Cardiovascular:     Rate and Rhythm: Normal rate and regular rhythm.  Pulmonary:     Breath sounds: No wheezing, rhonchi or rales.  Musculoskeletal:     Right lower leg: No edema.     Left lower leg: No edema.  Skin:    General: Skin is warm and dry.  Neurological:     General: No focal deficit present.      Diagnostic Review:  Last CBC Lab Results  Component Value Date   WBC 8.5 11/16/2020   HGB 14.4 11/16/2020   HCT 43.3 11/16/2020  MCV 93.7 11/16/2020   MCH 31.2 11/16/2020   RDW 14.6 11/16/2020   PLT 257 11/16/2020   Last metabolic panel Lab Results  Component Value Date   GLUCOSE 122 (H) 11/16/2020   NA 138 11/16/2020   K 3.4 (L) 11/16/2020   CL 104 11/16/2020   CO2 25 11/16/2020   BUN 13 11/16/2020   CREATININE 0.66 11/16/2020   EGFR 78.0 07/16/2024   CALCIUM  9.0 11/16/2020   PROT 7.5 11/16/2020   ALBUMIN 4.0 11/16/2020   BILITOT 0.4 11/16/2020   ALKPHOS 62 11/16/2020   AST 20 11/16/2020   ALT 19 11/16/2020   ANIONGAP 9 11/16/2020       Assessment & Plan:   Assessment & Plan Centrilobular emphysema (HCC)  Orders:   albuterol  (VENTOLIN  HFA) 108 (90 Base) MCG/ACT inhaler; Inhale 1-2 puffs into the lungs every 6 (six) hours  as needed for wheezing or shortness of breath.   budesonide-glycopyrrolate-formoterol (BREZTRI  AEROSPHERE) 160-9-4.8 MCG/ACT AERO inhaler; Inhale 2 puffs into the lungs 2 (two) times daily.   Assessment and Plan Assessment & Plan Centrilobular emphysema and chronic obstructive pulmonary disease (COPD) COPD with centrilobular emphysema, well-managed with Breztri . No significant dyspnea, occasional cough, minimal mucus production. - Continue Breztri , two puffs twice daily. - Provided refills for Breztri  and albuterol .  Pulmonary nodule, left lower lobe, under surveillance New 5.5 mm pulmonary nodule in left lower lobe, Lung-RADS 3 classification. - Scheduled follow-up CT scan in six months.  Tobacco use disorder, current daily smoker Current daily smoker, approximately one pack per day. Previous partial success with Wellbutrin . Discussed combination therapy with Wellbutrin  and NRT. Discussed for 4 minutes. - Start Wellbutrin  two weeks before quit date. - Use 14-21 mg nicotine  patches. - Consider mini nicotine  lozenges for breakthrough cravings. - Provided information on [place]  State Smoking Cessation Hotline.      Return in about 6 months (around 03/23/2025) for f/u visit Dr. Kara.   Dorn KATHEE Kara, MD

## 2024-09-22 NOTE — Assessment & Plan Note (Signed)
  Orders:   albuterol  (VENTOLIN  HFA) 108 (90 Base) MCG/ACT inhaler; Inhale 1-2 puffs into the lungs every 6 (six) hours as needed for wheezing or shortness of breath.   budesonide-glycopyrrolate-formoterol (BREZTRI  AEROSPHERE) 160-9-4.8 MCG/ACT AERO inhaler; Inhale 2 puffs into the lungs 2 (two) times daily.

## 2024-09-23 ENCOUNTER — Ambulatory Visit: Admitting: Podiatrist

## 2024-09-23 ENCOUNTER — Encounter: Payer: Self-pay | Admitting: Pulmonary Disease

## 2024-09-23 DIAGNOSIS — M79672 Pain in left foot: Secondary | ICD-10-CM

## 2024-09-23 DIAGNOSIS — M79671 Pain in right foot: Secondary | ICD-10-CM

## 2024-09-23 DIAGNOSIS — M778 Other enthesopathies, not elsewhere classified: Secondary | ICD-10-CM

## 2024-09-23 DIAGNOSIS — M7750 Other enthesopathy of unspecified foot: Secondary | ICD-10-CM

## 2024-09-26 ENCOUNTER — Encounter (HOSPITAL_COMMUNITY): Payer: Self-pay

## 2024-09-29 ENCOUNTER — Encounter (HOSPITAL_COMMUNITY): Admission: RE | Admit: 2024-09-29 | Source: Ambulatory Visit

## 2024-10-05 ENCOUNTER — Telehealth (HOSPITAL_COMMUNITY): Payer: Self-pay | Admitting: *Deleted

## 2024-10-05 NOTE — Telephone Encounter (Signed)
 Reaching out to patient to offer assistance regarding upcoming cardiac imaging study; pt verbalizes understanding of appt date/time, parking situation and where to check in, pre-test NPO status; name and call back number provided for further questions should they arise  Chase Copping RN Navigator Cardiac Imaging [facility] [phone] office [phone] cell  Patient aware to avoid caffeine 12 hours prior to her cardiac PET study.

## 2024-10-06 ENCOUNTER — Inpatient Hospital Stay (HOSPITAL_COMMUNITY): Admission: RE | Admit: 2024-10-06 | Discharge: 2024-10-06 | Attending: Cardiology

## 2024-10-06 DIAGNOSIS — I2510 Atherosclerotic heart disease of native coronary artery without angina pectoris: Secondary | ICD-10-CM | POA: Diagnosis present

## 2024-10-06 LAB — NM PET CT CARDIAC PERFUSION MULTI W/ABSOLUTE BLOODFLOW
LV dias vol: 93 mL (ref 46–106)
LV sys vol: 35 mL (ref 3.8–5.2)
MBFR: 2.31
Nuc Rest EF: 62 %
Nuc Stress EF: 70 %
Peak HR: 99 {beats}/min
Rest HR: 80 {beats}/min
Rest MBF: 1.3 ml/g/min
Rest Nuclear Isotope Dose: 28.1 mCi
ST Depression (mm): 0 mm
Stress MBF: 3 ml/g/min
Stress Nuclear Isotope Dose: 28.1 mCi

## 2024-10-06 MED ORDER — REGADENOSON 0.4 MG/5ML IV SOLN
0.4000 mg | Freq: Once | INTRAVENOUS | Status: AC
  Administered 2024-10-06: 14:00:00 0.4 mg via INTRAVENOUS

## 2024-10-06 MED ORDER — REGADENOSON 0.4 MG/5ML IV SOLN
INTRAVENOUS | Status: AC
  Filled 2024-10-06: qty 5

## 2024-10-06 MED ORDER — RUBIDIUM RB82 GENERATOR (RUBYFILL): 28.1000 | PACK | Freq: Once | INTRAVENOUS | Status: DC

## 2024-10-07 ENCOUNTER — Ambulatory Visit (HOSPITAL_COMMUNITY): Admission: RE | Admit: 2024-10-07 | Discharge: 2024-10-07 | Attending: Cardiology

## 2024-10-07 DIAGNOSIS — R600 Localized edema: Secondary | ICD-10-CM

## 2024-10-07 LAB — ECHOCARDIOGRAM COMPLETE
Area-P 1/2: 4.31 cm2
S' Lateral: 2.5 cm

## 2024-10-13 ENCOUNTER — Ambulatory Visit
Admission: RE | Admit: 2024-10-13 | Discharge: 2024-10-13 | Disposition: A | Discharge status: Home / Self Care | Source: Ambulatory Visit | Attending: Internal Medicine | Admitting: Internal Medicine

## 2024-10-13 DIAGNOSIS — Z1231 Encounter for screening mammogram for malignant neoplasm of breast: Secondary | ICD-10-CM

## 2024-10-21 ENCOUNTER — Telehealth: Payer: Self-pay

## 2024-10-21 NOTE — Telephone Encounter (Signed)
 10/21/24, orthotics are in the office, appt made 1/14 for pick up.  Charges needs to be billed

## 2024-11-03 LAB — LIPID PANEL
Chol/HDL Ratio: 2.8 ratio (ref 0.0–4.4)
Cholesterol, Total: 144 mg/dL (ref 100–199)
HDL: 52 mg/dL
LDL Chol Calc (NIH): 78 mg/dL (ref 0–99)
Triglycerides: 67 mg/dL (ref 0–149)
VLDL Cholesterol Cal: 14 mg/dL (ref 5–40)

## 2024-11-04 ENCOUNTER — Ambulatory Visit (INDEPENDENT_AMBULATORY_CARE_PROVIDER_SITE_OTHER): Admitting: Podiatrist

## 2024-11-04 DIAGNOSIS — M7742 Metatarsalgia, left foot: Secondary | ICD-10-CM

## 2024-11-04 DIAGNOSIS — M7741 Metatarsalgia, right foot: Secondary | ICD-10-CM | POA: Diagnosis not present

## 2024-11-04 DIAGNOSIS — M7750 Other enthesopathy of unspecified foot: Secondary | ICD-10-CM | POA: Diagnosis not present

## 2024-11-04 DIAGNOSIS — M778 Other enthesopathies, not elsewhere classified: Secondary | ICD-10-CM | POA: Diagnosis not present

## 2024-11-04 NOTE — Progress Notes (Signed)
 ORTHOTIC DISPENSING:   Reason for Visit:         Fitting and Delivery of Custom Fabricated Foot Orthoses Patient Report:            Patient reports comfort and is satisfied with device.   OBJECTIVE DATA: Patient History / Diagnosis:    No change in pathology Provided Device:                     Functional foot orthoses   GOAL OF ORTHOSIS - Improve gait - Decrease energy expenditure - Improve Balance - Provide Triplanar stability of foot complex - Facilitate motion   ACTIONS PERFORMED Patient was fit with custom foot orthoses   Patient was provided with verbal and written instruction and demonstration regarding wear, care, proper fit, function, and use of the orthosis.    Patient was also provided with verbal instruction regarding how to report any failures or malfunctions of the orthosis and necessary follow up care. Patient was also instructed to contact our office regarding any change in status that may affect the function of the orthosis.   Patient demonstrated understanding of all instructions.  Lamarr Salen, DPM   She is feeling the metatarsal pad more in the left vs right orthotic.  I advised her to try them out for a week or 2 and if it still bothersome to call and it can be adjusted.

## 2024-11-26 ENCOUNTER — Ambulatory Visit: Admitting: Cardiology

## 2024-11-26 NOTE — Progress Notes (Unsigned)
 " Cardiology Office Note:  .   Date:  11/26/2024  ID:  Gina [name], DOB 09-23-1961, MRN [id number] PCP: Gina Nottingham, MD  [facility] Providers Cardiologist:  Newman Lawrence, MD PCP: Gina Nottingham, MD  No chief complaint on file.     History of Present Illness: .    Gina [name] is a 64 y.o. female with CAD, mixed hyperlipidemia, nicotine  dependence   *** Patient is here for 1 year follow-up.  She has not had any new chest pain, but has occasional leg edema.  Her physical activity is limited due to calluses on her feet.  Unfortunately, she continues to smoke 1 pack/day.  Reviewed recent lipid panel results with the patient, details below.  There were no vitals filed for this visit.     ROS:  Review of Systems  Cardiovascular:  Negative for chest pain, dyspnea on exertion, leg swelling, palpitations and syncope.     Studies Reviewed: SABRA       EKG 08/28/2024: Normal sinus rhythm Normal ECG When compared with ECG of 06-Aug-2023 14:26, No significant change was found    Stress test 09/2024:   The study is normal. The study is low risk.   LV perfusion is normal.   Rest left ventricular function is normal. Rest EF: 62%. Stress left ventricular function is normal. Stress EF: 70%. End diastolic cavity size is normal. End systolic cavity size is normal.   Myocardial blood [name] was computed to be 1.21ml/g/min at rest and 3.00ml/g/min at stress. Global myocardial blood [name] reserve was 2.31 and was normal.   Coronary calcium  was present on the attenuation correction CT images. Mild coronary calcifications were present. Coronary calcifications were present in the left anterior descending artery distribution(s).  Echocardiogram 2022:  Left ventricle cavity is normal in size. Mild concentric hypertrophy of  the left ventricle. Normal global wall motion. Normal LV systolic function  with EF 71%. Doppler evidence of grade I (impaired) diastolic dysfunction,   normal LAP. No significant valvular abnormality.  Normal right atrial pressure.     Labs 06/2024: Chol 208, TG 67, HDL 53, LDL 143  07/17/2023: HbA1C 3.8% Chol 169, TG 47, HDL 57, LDL 102    Physical Exam:   Physical Exam Vitals and nursing note reviewed.  Constitutional:      General: She is not in acute distress. Neck:     Vascular: No JVD.  Cardiovascular:     Rate and Rhythm: Normal rate and regular rhythm.     Heart sounds: Normal heart sounds. No murmur heard. Pulmonary:     Effort: Pulmonary effort is normal.     Breath sounds: Normal breath sounds. No wheezing or rales.  Musculoskeletal:     Right lower leg: No edema.     Left lower leg: No edema.      VISIT DIAGNOSES: No diagnosis found.     ASSESSMENT AND PLAN: .    Gina [name] is a 64 y.o. female with CAD, mixed hyperlipidemia, nicotine  dependence    Coronary artery disease: Consistently noted on lung cancer screening CT chest. No clear anginal symptoms, physical activity is elevated. Recommend echocardiogram and PET/CT stress test for ischemia evaluation. See below regarding smoking cessation. Emphasized importance of compliance with lipid-lowering therapy. Continue Crestor  40 mg daily.  Check lipid panel in 10/2024. Given recent leg swelling, although trace, will also check proBNP.  Nicotine  dependence: Tobacco cessation counseling:  - Currently smoking 1 pack/day   - Patient was informed  of the dangers of tobacco abuse including stroke, cancer, and MI, as well as benefits of tobacco cessation. - Patient is willing to quit at this time. - Approximately 5 mins were spent counseling patient cessation techniques. We discussed various methods to help quit smoking, including deciding on a date to quit, joining a support group, pharmacological agents. Patient would like to use Wellbutrin  100 mg twice daily. - I will reassess her progress at the next follow-up visit   No orders of the  defined types were placed in this encounter.    F/u in 6 months  Signed, Newman JINNY Lawrence, MD  "

## 2025-01-21 ENCOUNTER — Ambulatory Visit (HOSPITAL_BASED_OUTPATIENT_CLINIC_OR_DEPARTMENT_OTHER)

## 2025-02-09 ENCOUNTER — Ambulatory Visit: Admitting: Cardiology
